# Patient Record
Sex: Male | Born: 1980
Health system: Southern US, Community
[De-identification: ages and names within clinical notes are randomized; demographics above are authoritative.]

## PROBLEM LIST (undated history)

## (undated) DIAGNOSIS — K219 Gastro-esophageal reflux disease without esophagitis: Secondary | ICD-10-CM

## (undated) DIAGNOSIS — G4700 Insomnia, unspecified: Secondary | ICD-10-CM

## (undated) HISTORY — DX: Insomnia, unspecified: G47.00

## (undated) HISTORY — PX: VASECTOMY: SHX75

## (undated) HISTORY — DX: Gastro-esophageal reflux disease without esophagitis: K21.9

---

## 2001-03-02 ENCOUNTER — Emergency Department (HOSPITAL_COMMUNITY): Admission: EM | Admit: 2001-03-02 | Discharge: 2001-03-02 | Payer: Self-pay | Admitting: Emergency Medicine

## 2001-03-02 ENCOUNTER — Encounter: Payer: Self-pay | Admitting: Emergency Medicine

## 2012-12-25 ENCOUNTER — Other Ambulatory Visit: Payer: Self-pay | Admitting: *Deleted

## 2012-12-25 MED ORDER — ZOLPIDEM TARTRATE 10 MG PO TABS: 10.0000 mg | ORAL_TABLET | Freq: Every evening | ORAL | Status: DC | PRN

## 2012-12-25 NOTE — Telephone Encounter (Signed)
LAST OV 1/14. LAST REFILL 10/31/12. CALL IN [facility] [place] [phone]

## 2013-01-05 ENCOUNTER — Telehealth: Payer: Self-pay | Admitting: *Deleted

## 2013-01-05 NOTE — Telephone Encounter (Signed)
Patient left message on my voicemail requesting refill on Ambien.  States that pharmacy has tried twice, but hasn't heard anything.

## 2013-01-06 NOTE — Telephone Encounter (Signed)
Done by Joyce Gross

## 2013-01-06 NOTE — Telephone Encounter (Signed)
RX called into pharmacy

## 2013-01-09 ENCOUNTER — Telehealth: Payer: Self-pay | Admitting: Nurse Practitioner

## 2013-01-09 NOTE — Telephone Encounter (Signed)
I called [facility] pharmacy in [place] and confirmed that it was filled and waiting for the patient to pick up. I called and advised the patient it was waiting for him at the pharmacy.

## 2013-03-31 ENCOUNTER — Other Ambulatory Visit: Payer: Self-pay | Admitting: *Deleted

## 2013-03-31 MED ORDER — PANTOPRAZOLE SODIUM 40 MG PO TBEC: 40.0000 mg | DELAYED_RELEASE_TABLET | Freq: Every day | ORAL | Status: DC

## 2013-03-31 NOTE — Telephone Encounter (Signed)
SLTT OV 1/14. MED NOT LISTED IN EPIC CHART BUT IN PAPER CHART. ACM PT

## 2013-04-07 ENCOUNTER — Other Ambulatory Visit: Payer: Self-pay | Admitting: *Deleted

## 2013-04-07 MED ORDER — ZOLPIDEM TARTRATE 10 MG PO TABS: 10.0000 mg | ORAL_TABLET | Freq: Every evening | ORAL | Status: DC | PRN

## 2013-04-07 NOTE — Telephone Encounter (Signed)
Patient last seen in office on 09-06-12 by [facility]. Rx last filled on 03-16-13 for #20. Please advise. If approved please route to Pool B so nurse can phone in to [place] in [place]

## 2013-04-07 NOTE — Telephone Encounter (Signed)
Please call in ambien rx with 1 refill 

## 2013-04-08 NOTE — Telephone Encounter (Signed)
Called in.

## 2013-07-24 ENCOUNTER — Other Ambulatory Visit: Payer: Self-pay | Admitting: Family Medicine

## 2013-07-24 ENCOUNTER — Ambulatory Visit (HOSPITAL_COMMUNITY)
Admission: RE | Admit: 2013-07-24 | Discharge: 2013-07-24 | Disposition: A | Discharge status: Home / Self Care | Payer: BC Managed Care – PPO | Source: Ambulatory Visit | Attending: Family Medicine | Admitting: Family Medicine

## 2013-07-24 ENCOUNTER — Encounter: Payer: Self-pay | Admitting: Family Medicine

## 2013-07-24 ENCOUNTER — Ambulatory Visit (INDEPENDENT_AMBULATORY_CARE_PROVIDER_SITE_OTHER): Payer: BC Managed Care – PPO | Admitting: Family Medicine

## 2013-07-24 ENCOUNTER — Ambulatory Visit (HOSPITAL_COMMUNITY): Payer: Self-pay

## 2013-07-24 ENCOUNTER — Encounter (INDEPENDENT_AMBULATORY_CARE_PROVIDER_SITE_OTHER): Payer: Self-pay

## 2013-07-24 VITALS — BP 125/82 | HR 75 | Temp 99.3°F | Ht 73.0 in | Wt 234.0 lb

## 2013-07-24 DIAGNOSIS — N5089 Other specified disorders of the male genital organs: Secondary | ICD-10-CM

## 2013-07-24 DIAGNOSIS — N451 Epididymitis: Secondary | ICD-10-CM

## 2013-07-24 DIAGNOSIS — N453 Epididymo-orchitis: Secondary | ICD-10-CM

## 2013-07-24 DIAGNOSIS — N433 Hydrocele, unspecified: Secondary | ICD-10-CM | POA: Insufficient documentation

## 2013-07-24 LAB — POCT UA - MICROSCOPIC ONLY
Bacteria, U Microscopic: NEGATIVE
Casts, Ur, LPF, POC: NEGATIVE
Crystals, Ur, HPF, POC: NEGATIVE
RBC, urine, microscopic: NEGATIVE
WBC, Ur, HPF, POC: NEGATIVE
Yeast, UA: NEGATIVE

## 2013-07-24 LAB — POCT URINALYSIS DIPSTICK
Bilirubin, UA: NEGATIVE
Blood, UA: NEGATIVE
Glucose, UA: NEGATIVE
Ketones, UA: NEGATIVE
Leukocytes, UA: NEGATIVE
Nitrite, UA: NEGATIVE
Protein, UA: NEGATIVE
Spec Grav, UA: >=1.03
Urobilinogen, UA: NEGATIVE
pH, UA: 6.5

## 2013-07-24 MED ORDER — DOXYCYCLINE HYCLATE 100 MG PO CAPS: 100.0000 mg | ORAL_CAPSULE | Freq: Two times a day (BID) | ORAL | Status: DC

## 2013-07-24 MED ORDER — PANTOPRAZOLE SODIUM 40 MG PO TBEC: 40.0000 mg | DELAYED_RELEASE_TABLET | Freq: Every day | ORAL | Status: DC

## 2013-07-24 MED ORDER — LIDOCAINE HCL 1 % IJ SOLN
250.0000 mg | INTRAMUSCULAR | Status: AC
  Administered 2013-07-24: 250 mg via INTRAMUSCULAR

## 2013-07-24 NOTE — Progress Notes (Signed)
  Subjective:    Patient ID: Alexander Fowler, male    DOB: 1981-02-10, 32 y.o.   MRN: [id number]  HPI Pt presents today with chief complaint of testicular pain  Was playing xbox kinect earlier in the week (boxing) Woke up next day feeling generally sore  Around lunchtime, pt had progressive severe L sided testicular pain and sore ness  No fever or chills No dysuria, increased urinary frequency, hematuria No hx/o STDs No new sexual partners (wife is in room with pt-both denyany new sexual partners). No discharge.  Pain has been intermittent, severe,  and not associated with activity.  Has back pain that is chronic, no change in severity.      Review of Systems  All other systems reviewed and are negative.       Objective:   Physical Exam  Constitutional: He appears well-developed and well-nourished.  HENT:  Head: Normocephalic and atraumatic.  Eyes: Conjunctivae are normal. Pupils are equal, round, and reactive to light.  Neck: Normal range of motion.  Cardiovascular: Normal rate and regular rhythm.   Pulmonary/Chest: Effort normal and breath sounds normal.  Abdominal: Soft.  Genitourinary: Penis normal. No penile tenderness.  ? l sided scrotal mass near base of pampiniform plexus     Musculoskeletal: Normal range of motion.  Neurological: He is alert.  Skin: Skin is warm.          Assessment & Plan:  Swollen testicle - Plan: POCT urinalysis dipstick, POCT UA - Microscopic Only  Epididymitis - Plan: doxycycline (VIBRAMYCIN) 100 MG capsule, Urine culture  Given exam and history, higher concern for epipdidymitis.  Rocephin 250mg   IM x1  Extended course of doxy Chek urine culture, urine GC/Chl, HIV, RPR  Scrotal ultrasound to assess for any mass.  Discussed infectious and GU red flags at length.  Go to ER if sxs worsen despite treatment.

## 2013-07-25 LAB — STD PANEL
HIV 1/O/2 Abs-Index Value: <1 (ref <1.00)
HIV-1/HIV-2 Ab: NONREACTIVE
Hepatitis B Surface Ag: NEGATIVE
RPR: NONREACTIVE

## 2013-07-25 LAB — URINE CULTURE: Organism ID, Bacteria: NO GROWTH

## 2013-07-28 LAB — GC/CHLAMYDIA PROBE AMP
Chlamydia trachomatis, NAA: NEGATIVE
Neisseria gonorrhoeae by PCR: NEGATIVE

## 2013-09-10 ENCOUNTER — Telehealth: Payer: Self-pay | Admitting: Family Medicine

## 2013-09-11 MED ORDER — ZOLPIDEM TARTRATE 10 MG PO TABS: 10.0000 mg | ORAL_TABLET | Freq: Every evening | ORAL | Status: DC | PRN

## 2013-09-11 NOTE — Telephone Encounter (Signed)
Please call in ambien 10mg 1 po qhs #30 1 refill 

## 2013-09-16 MED ORDER — ZOLPIDEM TARTRATE 10 MG PO TABS: 10.0000 mg | ORAL_TABLET | Freq: Every evening | ORAL | Status: DC | PRN

## 2013-09-16 NOTE — Telephone Encounter (Signed)
Pt aware, rx for ambein [facility]

## 2013-09-16 NOTE — Telephone Encounter (Signed)
Please call in ambien 10mg  1 po qhs #30 1 refill

## 2013-12-18 ENCOUNTER — Other Ambulatory Visit: Payer: Self-pay | Admitting: Nurse Practitioner

## 2013-12-21 NOTE — Telephone Encounter (Signed)
Last ov 11/14. Last refill 11/03/13. [facility]

## 2013-12-28 ENCOUNTER — Other Ambulatory Visit: Payer: Self-pay

## 2013-12-28 ENCOUNTER — Telehealth: Payer: Self-pay | Admitting: Family Medicine

## 2013-12-28 MED ORDER — ZOLPIDEM TARTRATE 10 MG PO TABS: 10.0000 mg | ORAL_TABLET | Freq: Every evening | ORAL | Status: DC | PRN

## 2013-12-28 NOTE — Telephone Encounter (Signed)
Last seen 07/24/13  Dr Alvester MorinNewton    If approved route [facility]  [phone]

## 2013-12-28 NOTE — Telephone Encounter (Signed)
Called in.

## 2013-12-28 NOTE — Telephone Encounter (Signed)
Please call in ambien with 1 refills 

## 2014-04-01 ENCOUNTER — Other Ambulatory Visit: Payer: Self-pay | Admitting: *Deleted

## 2014-04-01 ENCOUNTER — Telehealth: Payer: Self-pay | Admitting: Family Medicine

## 2014-04-01 MED ORDER — ZOLPIDEM TARTRATE 10 MG PO TABS: 10.0000 mg | ORAL_TABLET | Freq: Every evening | ORAL | Status: DC | PRN

## 2014-04-01 NOTE — Telephone Encounter (Signed)
[facility] Rx last filled on 02-18-14 for #30. Please advise. If approved please route to Pool B so [facility] [phone]

## 2014-04-01 NOTE — Telephone Encounter (Signed)
no more refills without being seen Please call in [place] with 1 refills

## 2014-04-01 NOTE — Telephone Encounter (Signed)
Rx already sent in

## 2014-04-02 NOTE — Telephone Encounter (Signed)
Called in.

## 2014-07-20 ENCOUNTER — Ambulatory Visit (INDEPENDENT_AMBULATORY_CARE_PROVIDER_SITE_OTHER): Payer: BC Managed Care – PPO | Admitting: Family Medicine

## 2014-07-20 ENCOUNTER — Encounter: Payer: Self-pay | Admitting: Family Medicine

## 2014-07-20 VITALS — BP 118/75 | HR 98 | Temp 97.8°F | Ht 73.0 in | Wt 236.0 lb

## 2014-07-20 DIAGNOSIS — K2100 Gastro-esophageal reflux disease with esophagitis, without bleeding: Secondary | ICD-10-CM

## 2014-07-20 DIAGNOSIS — G4700 Insomnia, unspecified: Secondary | ICD-10-CM

## 2014-07-20 MED ORDER — ZOLPIDEM TARTRATE 10 MG PO TABS: 10.0000 mg | ORAL_TABLET | Freq: Every evening | ORAL | Status: DC | PRN

## 2014-07-20 MED ORDER — PANTOPRAZOLE SODIUM 40 MG PO TBEC: 40.0000 mg | DELAYED_RELEASE_TABLET | Freq: Every day | ORAL | Status: DC

## 2014-07-20 NOTE — Progress Notes (Signed)
   Subjective:    Patient ID: Alexander Fowler, male    DOB: 08-04-1981, 33 y.o.   MRN: [id number]  HPI Patient is here for complaints of inattention.  He states he is having some problems at work.  He has been having problems keeping attention according to his wife.  He has been married for 15 months.  He states that his wife is noticing that he has had a change.  He works night shift and has been having sleeping problems.  He is taking ambien for sleep problems.  He denies depression.  He wants refills on his medications.  Review of Systems  Constitutional: Negative for fever.  HENT: Negative for ear pain.   Eyes: Negative for discharge.  Respiratory: Negative for cough.   Cardiovascular: Negative for chest pain.  Gastrointestinal: Negative for abdominal distention.  Endocrine: Negative for polyuria.  Genitourinary: Negative for difficulty urinating.  Musculoskeletal: Negative for gait problem and neck pain.  Skin: Negative for color change and rash.  Neurological: Negative for speech difficulty and headaches.  Psychiatric/Behavioral: Negative for agitation.       Objective:    BP 118/75 mmHg  Pulse 98  Temp(Src) 97.8 F (36.6 C) (Oral)  Ht 6\' 1"  (1.854 m)  Wt 236 lb (107.049 kg)  BMI 31.14 kg/m2   Physical Exam  Constitutional: He is oriented to person, place, and time. He appears well-developed and well-nourished.  HENT:  Head: Normocephalic and atraumatic.  Mouth/Throat: Oropharynx is clear and moist.  Eyes: Pupils are equal, round, and reactive to light.  Neck: Normal range of motion. Neck supple.  Cardiovascular: Normal rate and regular rhythm.   No murmur heard. Pulmonary/Chest: Effort normal and breath sounds normal.  Abdominal: Soft. Bowel sounds are normal. There is no tenderness.  Neurological: He is alert and oriented to person, place, and time.  Skin: Skin is warm and dry.  Psychiatric: He has a normal mood and affect.          Assessment & Plan:      ICD-9-CM ICD-10-CM   1. Insomnia 780.52 G47.00 zolpidem (AMBIEN) 10 MG tablet     DISCONTINUED: zolpidem (AMBIEN) 10 MG tablet  2. Gastroesophageal reflux disease with esophagitis 530.11 K21.0 pantoprazole (PROTONIX) 40 MG tablet     No Follow-up on file.  Alexander CanterWilliam J Oxford FNP

## 2015-02-15 ENCOUNTER — Encounter (INDEPENDENT_AMBULATORY_CARE_PROVIDER_SITE_OTHER): Payer: Self-pay

## 2015-02-15 ENCOUNTER — Ambulatory Visit (INDEPENDENT_AMBULATORY_CARE_PROVIDER_SITE_OTHER): Payer: BLUE CROSS/BLUE SHIELD | Admitting: Physician Assistant

## 2015-02-15 ENCOUNTER — Encounter: Payer: Self-pay | Admitting: Physician Assistant

## 2015-02-15 VITALS — BP 126/77 | HR 70 | Temp 98.4°F | Ht 73.0 in | Wt 242.0 lb

## 2015-02-15 DIAGNOSIS — G4700 Insomnia, unspecified: Secondary | ICD-10-CM

## 2015-02-15 DIAGNOSIS — R3581 Nocturnal polyuria: Secondary | ICD-10-CM

## 2015-02-15 DIAGNOSIS — R358 Other polyuria: Secondary | ICD-10-CM

## 2015-02-15 DIAGNOSIS — R351 Nocturia: Secondary | ICD-10-CM

## 2015-02-15 DIAGNOSIS — K2100 Gastro-esophageal reflux disease with esophagitis, without bleeding: Secondary | ICD-10-CM

## 2015-02-15 DIAGNOSIS — R350 Frequency of micturition: Secondary | ICD-10-CM

## 2015-02-15 LAB — POCT CBC
Granulocyte percent: 43.1 %G (ref 37–80)
HEMATOCRIT: 50 % (ref 43.5–53.7)
HEMOGLOBIN: 16.5 g/dL (ref 14.1–18.1)
Lymph, poc: 3.4 (ref 0.6–3.4)
MCH: 30.7 pg (ref 27–31.2)
MCHC: 33 g/dL (ref 31.8–35.4)
MCV: 93.2 fL (ref 80–97)
MPV: 7.7 fL (ref 0–99.8)
POC Granulocyte: 3.1 (ref 2–6.9)
POC LYMPH PERCENT: 47.9 %L (ref 10–50)
Platelet Count, POC: 190 10*3/uL (ref 142–424)
RBC: 5.37 M/uL (ref 4.69–6.13)
RDW, POC: 12.4 %
WBC: 7.2 10*3/uL (ref 4.6–10.2)

## 2015-02-15 LAB — POCT URINALYSIS DIPSTICK
BILIRUBIN UA: NEGATIVE
GLUCOSE UA: NEGATIVE
KETONES UA: NEGATIVE
Leukocytes, UA: NEGATIVE
Nitrite, UA: NEGATIVE
PH UA: 6
PROTEIN UA: NEGATIVE
RBC UA: NEGATIVE
SPEC GRAV UA: 1.015
Urobilinogen, UA: NEGATIVE

## 2015-02-15 LAB — POCT UA - MICROSCOPIC ONLY
BACTERIA, U MICROSCOPIC: NEGATIVE
CRYSTALS, UR, HPF, POC: NEGATIVE
Casts, Ur, LPF, POC: NEGATIVE
Mucus, UA: NEGATIVE
RBC, urine, microscopic: NEGATIVE
WBC, Ur, HPF, POC: NEGATIVE
Yeast, UA: NEGATIVE

## 2015-02-15 LAB — POCT GLYCOSYLATED HEMOGLOBIN (HGB A1C): HEMOGLOBIN A1C: 5.1

## 2015-02-15 MED ORDER — ZOLPIDEM TARTRATE 10 MG PO TABS: 10.0000 mg | ORAL_TABLET | Freq: Every day | ORAL | Status: DC

## 2015-02-15 MED ORDER — PANTOPRAZOLE SODIUM 40 MG PO TBEC: 40.0000 mg | DELAYED_RELEASE_TABLET | Freq: Every day | ORAL | Status: DC

## 2015-02-15 NOTE — Progress Notes (Signed)
Subjective:    Patient ID: Alexander Fowler, male    DOB: Dec 09, 1980, 34 y.o.   MRN: [id number]  HPI33 y/o male presents with c/o urinary frequency and urgency. He states that if he drinks a glass of water and coffee in the morning, he urinates 6 times over the next hour. He has not noticed this in the past  No family h/o DM. He has been taking Plexus for weight loss, which possibly has a diuretic but patient is unsure. He states that he had frequent urination prior to taking Plexus   He also needs refills for his Pantoprozole and ambien for GERD and insomnia .     Review of Systems  Constitutional: Negative.  Negative for unexpected weight change.  HENT: Negative.   Respiratory: Negative.   Gastrointestinal: Negative.        GERD under control with Pantoprozole  Endocrine: Positive for polyuria. Negative for polydipsia and polyphagia.       Nocturia  Genitourinary: Positive for urgency and frequency. Negative for dysuria, hematuria, flank pain and difficulty urinating.  Musculoskeletal: Negative.   Psychiatric/Behavioral:       Insomnia under control with ambien qhs        Objective:   Physical Exam  Constitutional: He is oriented to person, place, and time. He appears well-developed and well-nourished.  Abdominal: Soft. He exhibits no distension and no mass. There is no tenderness. There is no rebound and no guarding.  Musculoskeletal: Normal range of motion. He exhibits no edema.  Neurological: He is alert and oriented to person, place, and time.  Psychiatric: He has a normal mood and affect. His behavior is normal. Judgment and thought content normal.  Nursing note and vitals reviewed.  Results for orders placed or performed in visit on 02/15/15  POCT CBC  Result Value Ref Range   WBC 7.2 4.6 - 10.2 K/uL   Lymph, poc 3.4 0.6 - 3.4   POC LYMPH PERCENT 47.9 10 - 50 %L   POC Granulocyte 3.1 2 - 6.9   Granulocyte percent 43.1 37 - 80 %G   RBC 5.37 4.69 - 6.13 M/uL   Hemoglobin 16.5 14.1 - 18.1 g/dL   HCT, POC 50.0 43.5 - 53.7 %   MCV 93.2 80 - 97 fL   MCH, POC 30.7 27 - 31.2 pg   MCHC 33.0 31.8 - 35.4 g/dL   RDW, POC 12.4 %   Platelet Count, POC 190 142 - 424 K/uL   MPV 7.7 0 - 99.8 fL  POCT glycosylated hemoglobin (Hb A1C)  Result Value Ref Range   Hemoglobin A1C 5.1   POCT urinalysis dipstick  Result Value Ref Range   Color, UA yellow    Clarity, UA clear    Glucose, UA neg    Bilirubin, UA neg    Ketones, UA neg    Spec Grav, UA 1.015    Blood, UA neg    pH, UA 6.0    Protein, UA neg    Urobilinogen, UA negative    Nitrite, UA neg    Leukocytes, UA Negative Negative  POCT UA - Microscopic Only  Result Value Ref Range   WBC, Ur, HPF, POC neg    RBC, urine, microscopic neg    Bacteria, U Microscopic neg    Mucus, UA neg    Epithelial cells, urine per micros occ    Crystals, Ur, HPF, POC neg    Casts, Ur, LPF, POC neg    Yeast, UA  neg    Filed Vitals:   02/15/15 0817  BP: 126/77  Pulse: 70  Temp: 98.4 F (36.9 C)           Assessment & Plan:  1. Gastroesophageal reflux disease with esophagitis  - pantoprazole (PROTONIX) 40 MG tablet; Take 1 tablet (40 mg total) by mouth daily.  Dispense: 30 tablet; Refill: 11  2. Nocturnal polyuria  - CMP14+EGFR - PSA, total and free - POCT glycosylated hemoglobin (Hb A1C) - POCT urinalysis dipstick - POCT UA - Microscopic Only - Ambulatory referral to Urology  3. Frequency of urination and polyuria  - POCT CBC - CMP14+EGFR - PSA, total and free - POCT glycosylated hemoglobin (Hb A1C) - POCT urinalysis dipstick - POCT UA - Microscopic Only - Ambulatory referral to Urology  4. Insomnia  - zolpidem (AMBIEN) 10 MG tablet; Take 1 tablet (10 mg total) by mouth at bedtime.  Dispense: 30 tablet; Refill: 2   I feel that his frequency of urination is most likely due to consumption of fluids and output=intake, however, he would like further assessment to r/o underlying causes  related to abnormal bladder function. CMP ordered to r/o abnormal kidney function. U/A, CBC, PSA, HbA1C all WNL. Follow up with urology is preferred by patient.   Taylee Gunnells A. Jermon Stain PA-C

## 2015-02-16 LAB — CMP14+EGFR
ALBUMIN: 4.8 g/dL (ref 3.5–5.5)
ALK PHOS: 79 IU/L (ref 39–117)
ALT: 36 IU/L (ref 0–44)
AST: 32 IU/L (ref 0–40)
Albumin/Globulin Ratio: 1.9 (ref 1.1–2.5)
BUN/Creatinine Ratio: 16 (ref 8–19)
BUN: 18 mg/dL (ref 6–20)
Bilirubin Total: 0.5 mg/dL (ref 0.0–1.2)
CHLORIDE: 97 mmol/L (ref 97–108)
CO2: 26 mmol/L (ref 18–29)
Calcium: 9.9 mg/dL (ref 8.7–10.2)
Creatinine, Ser: 1.1 mg/dL (ref 0.76–1.27)
GFR calc Af Amer: 101 mL/min/{1.73_m2} (ref >59)
GFR, EST NON AFRICAN AMERICAN: 87 mL/min/{1.73_m2} (ref >59)
GLUCOSE: 66 mg/dL (ref 65–99)
Globulin, Total: 2.5 g/dL (ref 1.5–4.5)
Potassium: 4.5 mmol/L (ref 3.5–5.2)
SODIUM: 144 mmol/L (ref 134–144)
Total Protein: 7.3 g/dL (ref 6.0–8.5)

## 2015-02-16 LAB — PSA, TOTAL AND FREE
PSA, Free Pct: 57.5 %
PSA, Free: 0.23 ng/mL
Prostate Specific Ag, Serum: 0.4 ng/mL (ref 0.0–4.0)

## 2015-03-22 ENCOUNTER — Ambulatory Visit: Payer: Self-pay | Admitting: Urology

## 2015-11-15 ENCOUNTER — Telehealth: Payer: Self-pay | Admitting: Family

## 2015-11-15 ENCOUNTER — Ambulatory Visit (INDEPENDENT_AMBULATORY_CARE_PROVIDER_SITE_OTHER): Payer: BLUE CROSS/BLUE SHIELD | Admitting: Family

## 2015-11-15 ENCOUNTER — Encounter: Payer: Self-pay | Admitting: Family

## 2015-11-15 VITALS — BP 128/87 | HR 82 | Temp 97.4°F | Ht 73.0 in | Wt 239.0 lb

## 2015-11-15 DIAGNOSIS — J0100 Acute maxillary sinusitis, unspecified: Secondary | ICD-10-CM | POA: Diagnosis not present

## 2015-11-15 DIAGNOSIS — K2100 Gastro-esophageal reflux disease with esophagitis, without bleeding: Secondary | ICD-10-CM

## 2015-11-15 DIAGNOSIS — G4700 Insomnia, unspecified: Secondary | ICD-10-CM | POA: Diagnosis not present

## 2015-11-15 DIAGNOSIS — K219 Gastro-esophageal reflux disease without esophagitis: Secondary | ICD-10-CM | POA: Insufficient documentation

## 2015-11-15 LAB — CMP14+EGFR
ALT: 31 IU/L (ref 0–44)
AST: 31 IU/L (ref 0–40)
Albumin/Globulin Ratio: 1.7 (ref 1.2–2.2)
Albumin: 4.6 g/dL (ref 3.5–5.5)
Alkaline Phosphatase: 83 IU/L (ref 39–117)
BUN/Creatinine Ratio: 13 (ref 8–19)
BUN: 13 mg/dL (ref 6–20)
Bilirubin Total: 1 mg/dL (ref 0.0–1.2)
CALCIUM: 10 mg/dL (ref 8.7–10.2)
CO2: 28 mmol/L (ref 18–29)
Chloride: 99 mmol/L (ref 96–106)
Creatinine, Ser: 0.99 mg/dL (ref 0.76–1.27)
GFR calc Af Amer: 114 mL/min/{1.73_m2} (ref >59)
GFR, EST NON AFRICAN AMERICAN: 98 mL/min/{1.73_m2} (ref >59)
GLUCOSE: 75 mg/dL (ref 65–99)
Globulin, Total: 2.7 g/dL (ref 1.5–4.5)
POTASSIUM: 4.3 mmol/L (ref 3.5–5.2)
Sodium: 142 mmol/L (ref 134–144)
Total Protein: 7.3 g/dL (ref 6.0–8.5)

## 2015-11-15 MED ORDER — ZOLPIDEM TARTRATE 10 MG PO TABS: 10.0000 mg | ORAL_TABLET | Freq: Every day | ORAL | Status: DC

## 2015-11-15 MED ORDER — PANTOPRAZOLE SODIUM 40 MG PO TBEC: 40.0000 mg | DELAYED_RELEASE_TABLET | Freq: Every day | ORAL | Status: DC

## 2015-11-15 MED ORDER — AMOXICILLIN-POT CLAVULANATE 875-125 MG PO TABS: 1.0000 | ORAL_TABLET | Freq: Two times a day (BID) | ORAL | Status: DC

## 2015-11-15 MED ORDER — FLUTICASONE PROPIONATE 50 MCG/ACT NA SUSP: 2.0000 | Freq: Every day | NASAL | Status: DC

## 2015-11-15 NOTE — Progress Notes (Signed)
Subjective:    Patient ID: Alexander Fowler, male    DOB: 09-08-1980, 35 y.o.   MRN: [id number]  Pt presents to the office today for chronic follow up.  Sinus Problem This is a new problem. The current episode started in the past 7 days. The problem is unchanged. There has been no fever. His pain is at a severity of 10/10 (sinus pressure). He is experiencing no pain. Associated symptoms include congestion, ear pain, a hoarse voice, sinus pressure, sneezing and a sore throat. Pertinent negatives include no coughing or headaches. Past treatments include acetaminophen. The treatment provided mild relief.  Insomnia Primary symptoms: difficulty falling asleep, premature morning awakening, malaise/fatigue.  The current episode started more than one year. The onset quality is gradual. The problem occurs intermittently. The symptoms are aggravated by work stress. Past treatments include meditation. The treatment provided moderate relief.  Gastroesophageal Reflux He complains of a hoarse voice and a sore throat. He reports no coughing or no heartburn. This is a chronic problem. The current episode started more than 1 year ago. The problem occurs occasionally. The problem has been waxing and waning. The symptoms are aggravated by lying down. Risk factors include obesity. He has tried a PPI for the symptoms. The treatment provided significant relief.      Review of Systems  Constitutional: Positive for malaise/fatigue.  HENT: Positive for congestion, ear pain, hoarse voice, sinus pressure, sneezing and sore throat.   Respiratory: Negative.  Negative for cough.   Cardiovascular: Negative.   Gastrointestinal: Negative.  Negative for heartburn.  Endocrine: Negative.   Genitourinary: Negative.   Musculoskeletal: Negative.   Neurological: Negative.  Negative for headaches.  Hematological: Negative.   Psychiatric/Behavioral: The patient has insomnia.   All other systems reviewed and are  negative.      Objective:   Physical Exam  Constitutional: He is oriented to person, place, and time. He appears well-developed and well-nourished. No distress.  HENT:  Head: Normocephalic.  Right Ear: External ear normal.  Left Ear: External ear normal.  Nose: Right sinus exhibits maxillary sinus tenderness. Left sinus exhibits maxillary sinus tenderness.  Nasal passage erythemas with mild swelling  Oropharynx erythemas  Eyes: Pupils are equal, round, and reactive to light. Right eye exhibits no discharge. Left eye exhibits no discharge.  Neck: Normal range of motion. Neck supple. No thyromegaly present.  Cardiovascular: Normal rate, regular rhythm, normal heart sounds and intact distal pulses.   No murmur heard. Pulmonary/Chest: Effort normal and breath sounds normal. No respiratory distress. He has no wheezes.  Abdominal: Soft. Bowel sounds are normal. He exhibits no distension. There is no tenderness.  Musculoskeletal: Normal range of motion. He exhibits no edema or tenderness.  Neurological: He is alert and oriented to person, place, and time. He has normal reflexes. No cranial nerve deficit.  Skin: Skin is warm and dry. No rash noted. No erythema.  Psychiatric: He has a normal mood and affect. His behavior is normal. Judgment and thought content normal.  Vitals reviewed.     BP 128/87 mmHg  Pulse 82  Temp(Src) 97.4 F (36.3 C) (Oral)  Ht '6\' 1"'$  (1.854 m)  Wt 239 lb (108.41 kg)  BMI 31.54 kg/m2     Assessment & Plan:  1. Gastroesophageal reflux disease, esophagitis presence not specified - CMP14+EGFR  2. Acute maxillary sinusitis, recurrence not specified -- Take meds as prescribed - Use a cool mist humidifier  -Use saline nose sprays frequently -Saline irrigations of the nose can  be very helpful if done frequently.  * 4X daily for 1 week*  * Use of a nettie pot can be helpful with this. Follow directions with this* -Force fluids -For any cough or  congestion  Use plain Mucinex- regular strength or max strength is fine   * Children- consult with Pharmacist for dosing -For fever or aces or pains- take tylenol or ibuprofen appropriate for age and weight.  * for fevers greater than 101 orally you may alternate ibuprofen and tylenol every  3 hours. -Throat lozenges if help - CMP14+EGFR - amoxicillin-clavulanate (AUGMENTIN) 875-125 MG tablet; Take 1 tablet by mouth 2 (two) times daily.  Dispense: 14 tablet; Refill: 0 - fluticasone (FLONASE) 50 MCG/ACT nasal spray; Place 2 sprays into both nostrils daily.  Dispense: 16 g; Refill: 6  3. Insomnia    Continue all meds Labs pending Health Maintenance reviewed Diet and exercise encouraged RTO 1 year   Evelina Dun, FNP

## 2015-11-15 NOTE — Patient Instructions (Signed)
Sinusitis, Adult Sinusitis is redness, soreness, and inflammation of the paranasal sinuses. Paranasal sinuses are air pockets within the bones of your face. They are located beneath your eyes, in the middle of your forehead, and above your eyes. In healthy paranasal sinuses, mucus is able to drain out, and air is able to circulate through them by way of your nose. However, when your paranasal sinuses are inflamed, mucus and air can become trapped. This can allow bacteria and other germs to grow and cause infection. Sinusitis can develop quickly and last only a short time (acute) or continue over a long period (chronic). Sinusitis that lasts for more than 12 weeks is considered chronic. CAUSES Causes of sinusitis include:  Allergies.  Structural abnormalities, such as displacement of the cartilage that separates your nostrils (deviated septum), which can decrease the air flow through your nose and sinuses and affect sinus drainage.  Functional abnormalities, such as when the small hairs (cilia) that line your sinuses and help remove mucus do not work properly or are not present. SIGNS AND SYMPTOMS Symptoms of acute and chronic sinusitis are the same. The primary symptoms are pain and pressure around the affected sinuses. Other symptoms include:  Upper toothache.  Earache.  Headache.  Bad breath.  Decreased sense of smell and taste.  A cough, which worsens when you are lying flat.  Fatigue.  Fever.  Thick drainage from your nose, which often is green and may contain pus (purulent).  Swelling and warmth over the affected sinuses. DIAGNOSIS Your health care provider will perform a physical exam. During your exam, your health care provider may perform any of the following to help determine if you have acute sinusitis or chronic sinusitis:  Look in your nose for signs of abnormal growths in your nostrils (nasal polyps).  Tap over the affected sinus to check for signs of  infection.  View the inside of your sinuses using an imaging device that has a light attached (endoscope). If your health care provider suspects that you have chronic sinusitis, one or more of the following tests may be recommended:  Allergy tests.  Nasal culture. A sample of mucus is taken from your nose, sent to a lab, and screened for bacteria.  Nasal cytology. A sample of mucus is taken from your nose and examined by your health care provider to determine if your sinusitis is related to an allergy. TREATMENT Most cases of acute sinusitis are related to a viral infection and will resolve on their own within 10 days. Sometimes, medicines are prescribed to help relieve symptoms of both acute and chronic sinusitis. These may include pain medicines, decongestants, nasal steroid sprays, or saline sprays. However, for sinusitis related to a bacterial infection, your health care provider will prescribe antibiotic medicines. These are medicines that will help kill the bacteria causing the infection. Rarely, sinusitis is caused by a fungal infection. In these cases, your health care provider will prescribe antifungal medicine. For some cases of chronic sinusitis, surgery is needed. Generally, these are cases in which sinusitis recurs more than 3 times per year, despite other treatments. HOME CARE INSTRUCTIONS  Drink plenty of water. Water helps thin the mucus so your sinuses can drain more easily.  Use a humidifier.  Inhale steam 3-4 times a day (for example, sit in the bathroom with the shower running).  Apply a warm, moist washcloth to your face 3-4 times a day, or as directed by your health care provider.  Use saline nasal sprays to help   moisten and clean your sinuses.  Take medicines only as directed by your health care provider.  If you were prescribed either an antibiotic or antifungal medicine, finish it all even if you start to feel better. SEEK IMMEDIATE MEDICAL CARE IF:  You have  increasing pain or severe headaches.  You have nausea, vomiting, or drowsiness.  You have swelling around your face.  You have vision problems.  You have a stiff neck.  You have difficulty breathing.   This information is not intended to replace advice given to you by your health care provider. Make sure you discuss any questions you have with your health care provider.   Document Released: 08/20/2005 Document Revised: 09/10/2014 Document Reviewed: 09/04/2011 Elsevier Interactive Patient Education 2016 [employer]  - Take meds as prescribed - Use a cool mist humidifier  -Use saline nose sprays frequently -Saline irrigations of the nose can be very helpful if done frequently.  * 4X daily for 1 week*  * Use of a nettie pot can be helpful with this. Follow directions with this* -Force fluids -For any cough or congestion  Use plain Mucinex- regular strength or max strength is fine   * Children- consult with Pharmacist for dosing -For fever or aces or pains- take tylenol or ibuprofen appropriate for age and weight.  * for fevers greater than 101 orally you may alternate ibuprofen and tylenol every  3 hours. -Throat lozenges if help   Ronak Duquette, FNP   

## 2015-11-15 NOTE — Addendum Note (Signed)
Addended by: Jannifer RodneyHAWKS, Sally Reimers A on: 11/15/2015 10:41 AM   Modules accepted: Orders

## 2015-11-15 NOTE — Telephone Encounter (Signed)
PLease call in medication and let patient knw=ow. Thanks

## 2015-11-15 NOTE — Telephone Encounter (Signed)
Patient aware.

## 2015-12-31 DIAGNOSIS — M545 Low back pain: Secondary | ICD-10-CM | POA: Diagnosis not present

## 2016-01-02 DIAGNOSIS — S29012A Strain of muscle and tendon of back wall of thorax, initial encounter: Secondary | ICD-10-CM | POA: Diagnosis not present

## 2016-01-02 DIAGNOSIS — M546 Pain in thoracic spine: Secondary | ICD-10-CM | POA: Diagnosis not present

## 2016-01-02 DIAGNOSIS — S134XXA Sprain of ligaments of cervical spine, initial encounter: Secondary | ICD-10-CM | POA: Diagnosis not present

## 2016-01-02 DIAGNOSIS — S39012A Strain of muscle, fascia and tendon of lower back, initial encounter: Secondary | ICD-10-CM | POA: Diagnosis not present

## 2016-01-03 DIAGNOSIS — S29012A Strain of muscle and tendon of back wall of thorax, initial encounter: Secondary | ICD-10-CM | POA: Diagnosis not present

## 2016-01-03 DIAGNOSIS — S39012A Strain of muscle, fascia and tendon of lower back, initial encounter: Secondary | ICD-10-CM | POA: Diagnosis not present

## 2016-01-03 DIAGNOSIS — M546 Pain in thoracic spine: Secondary | ICD-10-CM | POA: Diagnosis not present

## 2016-01-03 DIAGNOSIS — S134XXA Sprain of ligaments of cervical spine, initial encounter: Secondary | ICD-10-CM | POA: Diagnosis not present

## 2016-01-04 DIAGNOSIS — S29012A Strain of muscle and tendon of back wall of thorax, initial encounter: Secondary | ICD-10-CM | POA: Diagnosis not present

## 2016-01-04 DIAGNOSIS — S134XXA Sprain of ligaments of cervical spine, initial encounter: Secondary | ICD-10-CM | POA: Diagnosis not present

## 2016-01-04 DIAGNOSIS — S39012A Strain of muscle, fascia and tendon of lower back, initial encounter: Secondary | ICD-10-CM | POA: Diagnosis not present

## 2016-01-04 DIAGNOSIS — M546 Pain in thoracic spine: Secondary | ICD-10-CM | POA: Diagnosis not present

## 2016-04-06 ENCOUNTER — Ambulatory Visit: Payer: BLUE CROSS/BLUE SHIELD | Admitting: Family Medicine

## 2016-04-06 ENCOUNTER — Ambulatory Visit (INDEPENDENT_AMBULATORY_CARE_PROVIDER_SITE_OTHER): Payer: BLUE CROSS/BLUE SHIELD | Admitting: Family Medicine

## 2016-04-06 ENCOUNTER — Telehealth: Payer: Self-pay | Admitting: Family Medicine

## 2016-04-06 ENCOUNTER — Encounter: Payer: Self-pay | Admitting: Family Medicine

## 2016-04-06 VITALS — BP 128/83 | HR 75 | Temp 98.5°F | Ht 73.0 in | Wt 261.4 lb

## 2016-04-06 DIAGNOSIS — K580 Irritable bowel syndrome with diarrhea: Secondary | ICD-10-CM

## 2016-04-06 DIAGNOSIS — K604 Rectal fistula: Secondary | ICD-10-CM | POA: Diagnosis not present

## 2016-04-06 DIAGNOSIS — K589 Irritable bowel syndrome without diarrhea: Secondary | ICD-10-CM | POA: Insufficient documentation

## 2016-04-06 MED ORDER — HYDROCORTISONE ACETATE 25 MG RE SUPP: 25.0000 mg | Freq: Two times a day (BID) | RECTAL | 0 refills | Status: DC

## 2016-04-06 MED ORDER — CIPROFLOXACIN HCL 500 MG PO TABS: 500.0000 mg | ORAL_TABLET | Freq: Two times a day (BID) | ORAL | 0 refills | Status: DC

## 2016-04-06 MED ORDER — ELUXADOLINE 100 MG PO TABS: 100.0000 mg | ORAL_TABLET | Freq: Two times a day (BID) | ORAL | 1 refills | Status: DC

## 2016-04-06 NOTE — Progress Notes (Signed)
BP 128/83 (BP Location: Right Arm, Patient Position: Sitting, Cuff Size: Normal)   Pulse 75   Temp 98.5 F (36.9 C) (Oral)   Ht 6\' 1"  (1.854 m)   Wt 261 lb 6.4 oz (118.6 kg)   BMI 34.49 kg/m    Subjective:    Patient ID: Alexander Fowler, male    DOB: May 20, 1981, 35 y.o.   MRN: [id number]  HPI: Alexander Fowler is a 35 y.o. male presenting on 04/06/2016 for fistual   HPI Rectal drainage and pain Patient has had rectal drainage and tenderness specifically on the right side that's been going on for at least a week now. He has had rectal fistulas twice in his history. They were both treated initially medically but then one had to be fixed surgically. He denies any fevers or chills. He says he does have very frequent bowel movements at least 4 times a day on a regular basis that's been going on since he was 13. The drainage has been coming out of his rectum has been thicker and white in nature. He denies any blood in his stool but has had a little bit of blood when he wipes. He is also had hemorrhoids previously.  Relevant past medical, surgical, family and social history reviewed and updated as indicated. Interim medical history since our last visit reviewed. Allergies and medications reviewed and updated.  Review of Systems  Constitutional: Negative for fever.  HENT: Negative for ear discharge and ear pain.   Eyes: Negative for discharge and visual disturbance.  Respiratory: Negative for shortness of breath and wheezing.   Cardiovascular: Negative for chest pain and leg swelling.  Gastrointestinal: Positive for anal bleeding, diarrhea and rectal pain. Negative for abdominal pain, blood in stool, constipation, nausea and vomiting.  Genitourinary: Negative for difficulty urinating.  Musculoskeletal: Negative for back pain and gait problem.  Skin: Negative for rash.  Neurological: Negative for syncope, light-headedness and headaches.  All other systems reviewed and are  negative.   Per HPI unless specifically indicated above     Medication List       Accurate as of 04/06/16 10:16 AM. Always use your most recent med list.          ciprofloxacin 500 MG tablet Commonly known as:  CIPRO Take 1 tablet (500 mg total) by mouth 2 (two) times daily.   Eluxadoline 100 MG Tabs Commonly known as:  VIBERZI Take 100 mg by mouth 2 (two) times daily.   hydrocortisone 25 MG suppository Commonly known as:  ANUSOL-HC Place 1 suppository (25 mg total) rectally 2 (two) times daily.   pantoprazole 40 MG tablet Commonly known as:  PROTONIX Take 1 tablet (40 mg total) by mouth daily.   zolpidem 10 MG tablet Commonly known as:  AMBIEN Take 1 tablet (10 mg total) by mouth at bedtime.          Objective:    BP 128/83 (BP Location: Right Arm, Patient Position: Sitting, Cuff Size: Normal)   Pulse 75   Temp 98.5 F (36.9 C) (Oral)   Ht 6\' 1"  (1.854 m)   Wt 261 lb 6.4 oz (118.6 kg)   BMI 34.49 kg/m   Wt Readings from Last 3 Encounters:  04/06/16 261 lb 6.4 oz (118.6 kg)  11/15/15 239 lb (108.4 kg)  02/15/15 242 lb (109.8 kg)    Physical Exam  Constitutional: He is oriented to person, place, and time. He appears well-developed and well-nourished. No distress.  Eyes: Conjunctivae and  EOM are normal. Pupils are equal, round, and reactive to light. Right eye exhibits no discharge. No scleral icterus.  Neck: Neck supple. No thyromegaly present.  Cardiovascular: Normal rate, regular rhythm, normal heart sounds and intact distal pulses.   No murmur heard. Pulmonary/Chest: Effort normal and breath sounds normal. No respiratory distress. He has no wheezes.  Abdominal: Soft. Bowel sounds are normal. He exhibits no distension. There is no tenderness. There is no rebound and no guarding.  Genitourinary: Prostate normal. Rectal exam shows external hemorrhoid, fissure and tenderness. Rectal exam shows no internal hemorrhoid, no mass and anal tone normal.   Musculoskeletal: Normal range of motion. He exhibits no edema.  Lymphadenopathy:    He has no cervical adenopathy.  Neurological: He is alert and oriented to person, place, and time. Coordination normal.  Skin: Skin is warm and dry. No rash noted. He is not diaphoretic.  Psychiatric: He has a normal mood and affect. His behavior is normal.  Nursing note and vitals reviewed.       Assessment & Plan:   Problem List Items Addressed This Visit      Digestive   Irritable bowel syndrome   Relevant Medications   Eluxadoline (VIBERZI) 100 MG TABS    Other Visit Diagnoses    Rectal fistula    -  Primary   Concern for small rectal fistula will try antibiotic and steroid cream see if it'll go away on its own, if not then will refer to colorectal   Relevant Medications   ciprofloxacin (CIPRO) 500 MG tablet   hydrocortisone (ANUSOL-HC) 25 MG suppository       Follow up plan: Return in about 2 months (around 06/06/2016), or if symptoms worsen or fail to improve, for Follow-up IBS.  Counseling provided for all of the vaccine components No orders of the defined types were placed in this encounter.   Arville Care, MD [facility] 04/06/2016, 10:16 AM

## 2016-04-09 MED ORDER — HYDROCORTISONE 2.5 % RE CREA: 1.0000 "application " | TOPICAL_CREAM | Freq: Two times a day (BID) | RECTAL | 0 refills | Status: DC

## 2016-04-09 NOTE — Telephone Encounter (Signed)
I was just trying to send hydrocortisone preparation H Like cream. I don't know what ended up getting sent but just have them give him whatever kind of rectal hydrocortisone is cheapest.

## 2016-04-09 NOTE — Telephone Encounter (Signed)
Pt aware and cream sent in

## 2016-04-18 ENCOUNTER — Ambulatory Visit (INDEPENDENT_AMBULATORY_CARE_PROVIDER_SITE_OTHER): Payer: BLUE CROSS/BLUE SHIELD | Admitting: Family Medicine

## 2016-04-18 ENCOUNTER — Encounter: Payer: Self-pay | Admitting: Family Medicine

## 2016-04-18 VITALS — BP 120/84 | HR 88 | Temp 97.2°F | Ht 72.0 in | Wt 266.4 lb

## 2016-04-18 DIAGNOSIS — K6289 Other specified diseases of anus and rectum: Secondary | ICD-10-CM | POA: Diagnosis not present

## 2016-04-18 DIAGNOSIS — K644 Residual hemorrhoidal skin tags: Secondary | ICD-10-CM

## 2016-04-18 MED ORDER — MESALAMINE 1000 MG RE SUPP: 1000.0000 mg | Freq: Two times a day (BID) | RECTAL | 0 refills | Status: DC

## 2016-04-18 NOTE — Progress Notes (Signed)
Subjective:  Patient ID: Alexander Fowler, male    DOB: 06-Apr-1981  Age: 35 y.o. MRN: [id number]  CC: Hemorrhoids (x 2-3 days)   HPI Alexander Fowler presents for Intense itching and discomfort at his rectal region. He's been using the hemorrhoid cream given to him by Dr. Louanne Skyeettinger on the fourth. It helps for about an hour but unfortunately wears often leaves him with intense discomfort. He took off from work for a couple of days. Today he tried to go back. 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 He states that the drainage he was experiencing at his last visit has cleared with use of the antibiotic offered by Dr. Louanne Skyeettinger.   History Alexander Fowler has a past medical history of GERD (gastroesophageal reflux disease) and Insomnia.   He has a past surgical history that includes Vasectomy.   His family history includes Heart disease in his mother; Hypertension in his mother.He reports that he has never smoked. He has never used smokeless tobacco. He reports that he drinks alcohol. He reports that he does not use drugs.    Medication List       Accurate as of 04/18/16 12:44 PM. Always use your most recent med list.          Eluxadoline 100 MG Tabs Commonly known as:  VIBERZI Take 100 mg by mouth 2 (two) times daily.   hydrocortisone 2.5 % rectal cream Commonly known as:  ANUSOL-HC Place 1 application rectally 2 (two) times daily.   mesalamine 1000 MG suppository Commonly known as:  CANASA Place 1 suppository (1,000 mg total) rectally 2 (two) times daily.   pantoprazole 40 MG tablet Commonly known as:  PROTONIX Take 1 tablet (40 mg total) by mouth daily.   zolpidem 10 MG tablet Commonly known as:  AMBIEN Take 1 tablet (10 mg total) by mouth at bedtime.        ROS Review of Systems  Constitutional: Negative for chills, diaphoresis and fever.  Gastrointestinal: Positive for blood in stool and  rectal pain. Negative for abdominal distention, abdominal pain, constipation, diarrhea, nausea and vomiting.  Genitourinary: Negative for dysuria and hematuria.  Musculoskeletal: Negative for arthralgias and joint swelling.  Skin: Negative for rash.    Objective:  BP 120/84 (BP Location: Left Arm, Patient Position: Sitting, Cuff Size: Large)   Pulse 88   Temp 97.2 F (36.2 C) (Oral)   Ht 6' (1.829 m)   Wt 266 lb 6.4 oz (120.8 kg)   SpO2 97%   BMI 36.13 kg/m   BP Readings from Last 3 Encounters:  04/18/16 120/84  04/06/16 128/83  11/15/15 128/87    Wt Readings from Last 3 Encounters:  04/18/16 266 lb 6.4 oz (120.8 kg)  04/06/16 261 lb 6.4 oz (118.6 kg)  11/15/15 239 lb (108.4 kg)     Physical Exam  Constitutional: He is oriented to person, place, and time. He appears well-developed and well-nourished.  HENT:  Head: Normocephalic and atraumatic.  Right Ear: External ear normal.  Left Ear: External ear normal.  Mouth/Throat: No oropharyngeal exudate or posterior oropharyngeal erythema.  Eyes: Pupils are equal, round, and reactive to light.  Neck: Normal range of motion. Neck supple.  Cardiovascular: Normal rate and regular rhythm.   No murmur heard. Pulmonary/Chest: Breath sounds normal. No respiratory distress.  Genitourinary: Prostate normal. Rectal exam shows external hemorrhoid and internal hemorrhoid (with irregular surface and proctitis changes). Rectal  exam shows no fissure, no mass, anal tone normal and guaiac negative stool.  Neurological: He is alert and oriented to person, place, and time.  Vitals reviewed.    Lab Results  Component Value Date   WBC 7.2 02/15/2015   HGB 16.5 02/15/2015   HCT 50.0 02/15/2015   GLUCOSE 75 11/15/2015   ALT 31 11/15/2015   AST 31 11/15/2015   NA 142 11/15/2015   K 4.3 11/15/2015   CL 99 11/15/2015   CREATININE 0.99 11/15/2015   BUN 13 11/15/2015   CO2 28 11/15/2015   HGBA1C 5.1 02/15/2015    [place] Scrotum  Result  Date: 07/24/2013 CLINICAL DATA:  Scrotal swelling, worse on the left. EXAM: SCROTAL ULTRASOUND DOPPLER ULTRASOUND OF THE TESTICLES TECHNIQUE: Complete ultrasound examination of the testicles, epididymis, and other scrotal structures was performed. Color and spectral Doppler ultrasound were also utilized to evaluate blood flow to the testicles. COMPARISON:  None. FINDINGS: Right testicle Measurements: 5.3 x 2.8 x 3.7 cm. No mass or microlithiasis visualized. Left testicle Measurements: 5.5 x 2.9 x 3.5 cm. A single tiny microcalcification is identified but there is no microlithiasis. No mass is present. Right epididymis:  Normal in size and appearance. Left epididymis:  Normal in size and appearance. Hydrocele:  Very small bilateral hydroceles are identified. Varicocele:  None visualized. Pulsed Doppler interrogation of both testes demonstrates low resistance arterial and venous waveforms bilaterally. IMPRESSION: No acute or focal abnormality. Very small bilateral hydroceles are noted. Electronically Signed   By: Drusilla Kannerhomas  Dalessio M.D.   On: 07/24/2013 15:36   [place] Art/ven Flow Abd Pelv Doppler  Result Date: 07/24/2013 CLINICAL DATA:  Scrotal swelling, worse on the left. EXAM: SCROTAL ULTRASOUND DOPPLER ULTRASOUND OF THE TESTICLES TECHNIQUE: Complete ultrasound examination of the testicles, epididymis, and other scrotal structures was performed. Color and spectral Doppler ultrasound were also utilized to evaluate blood flow to the testicles. COMPARISON:  None. FINDINGS: Right testicle Measurements: 5.3 x 2.8 x 3.7 cm. No mass or microlithiasis visualized. Left testicle Measurements: 5.5 x 2.9 x 3.5 cm. A single tiny microcalcification is identified but there is no microlithiasis. No mass is present. Right epididymis:  Normal in size and appearance. Left epididymis:  Normal in size and appearance. Hydrocele:  Very small bilateral hydroceles are identified. Varicocele:  None visualized. Pulsed Doppler interrogation  of both testes demonstrates low resistance arterial and venous waveforms bilaterally. IMPRESSION: No acute or focal abnormality. Very small bilateral hydroceles are noted. Electronically Signed   By: Drusilla Kannerhomas  Dalessio M.D.   On: 07/24/2013 15:36    Assessment & Plan:   Alexander Fowler was seen today for hemorrhoids.  Diagnoses and all orders for this visit:  Proctitis  Residual hemorrhoidal skin tags  Other orders -     mesalamine (CANASA) 1000 MG suppository; Place 1 suppository (1,000 mg total) rectally 2 (two) times daily.     I have discontinued Alexander Fowler's ciprofloxacin. I am also having him start on mesalamine. Additionally, I am having him maintain his pantoprazole, zolpidem, Eluxadoline, and hydrocortisone.  Meds ordered this encounter  Medications  . mesalamine (CANASA) 1000 MG suppository    Sig: Place 1 suppository (1,000 mg total) rectally 2 (two) times daily.    Dispense:  60 suppository    Refill:  0     Follow-up: Return if symptoms worsen or fail to improve.  Mechele ClaudeWarren Kao Berkheimer, M.D.

## 2016-06-08 ENCOUNTER — Ambulatory Visit: Payer: BLUE CROSS/BLUE SHIELD | Admitting: Family Medicine

## 2016-06-08 ENCOUNTER — Ambulatory Visit: Payer: BLUE CROSS/BLUE SHIELD | Admitting: Family

## 2016-06-11 ENCOUNTER — Encounter: Payer: Self-pay | Admitting: Family Medicine

## 2016-06-18 ENCOUNTER — Other Ambulatory Visit: Payer: Self-pay | Admitting: Family Medicine

## 2016-06-18 MED ORDER — ZOLPIDEM TARTRATE 10 MG PO TABS: 10.0000 mg | ORAL_TABLET | Freq: Every day | ORAL | 3 refills | Status: DC

## 2016-06-18 NOTE — Telephone Encounter (Signed)
Refill called to [facility] 

## 2016-07-20 ENCOUNTER — Other Ambulatory Visit: Payer: Self-pay | Admitting: Family Medicine

## 2016-07-20 DIAGNOSIS — K580 Irritable bowel syndrome with diarrhea: Secondary | ICD-10-CM

## 2016-07-30 ENCOUNTER — Other Ambulatory Visit: Payer: Self-pay | Admitting: Family Medicine

## 2016-07-30 DIAGNOSIS — K580 Irritable bowel syndrome with diarrhea: Secondary | ICD-10-CM

## 2016-08-20 ENCOUNTER — Other Ambulatory Visit: Payer: Self-pay | Admitting: Family Medicine

## 2016-08-20 DIAGNOSIS — K580 Irritable bowel syndrome with diarrhea: Secondary | ICD-10-CM

## 2016-08-21 ENCOUNTER — Telehealth: Payer: Self-pay

## 2016-08-21 DIAGNOSIS — S134XXA Sprain of ligaments of cervical spine, initial encounter: Secondary | ICD-10-CM | POA: Diagnosis not present

## 2016-08-21 DIAGNOSIS — S29012A Strain of muscle and tendon of back wall of thorax, initial encounter: Secondary | ICD-10-CM | POA: Diagnosis not present

## 2016-08-21 DIAGNOSIS — S39012A Strain of muscle, fascia and tendon of lower back, initial encounter: Secondary | ICD-10-CM | POA: Diagnosis not present

## 2016-08-21 DIAGNOSIS — M546 Pain in thoracic spine: Secondary | ICD-10-CM | POA: Diagnosis not present

## 2016-08-21 DIAGNOSIS — K580 Irritable bowel syndrome with diarrhea: Secondary | ICD-10-CM

## 2016-08-21 MED ORDER — ELUXADOLINE 100 MG PO TABS: 1.0000 | ORAL_TABLET | Freq: Two times a day (BID) | ORAL | 5 refills | Status: DC

## 2016-08-21 NOTE — Telephone Encounter (Signed)
Okay at current level for 6 mos. Thanks ws 

## 2016-08-21 NOTE — Telephone Encounter (Signed)
Patient last seen 04/18/2016, wants refill of Viberzi.  Please advise and route to pools

## 2016-08-22 ENCOUNTER — Ambulatory Visit (INDEPENDENT_AMBULATORY_CARE_PROVIDER_SITE_OTHER): Payer: BLUE CROSS/BLUE SHIELD | Admitting: Family

## 2016-08-22 ENCOUNTER — Encounter: Payer: Self-pay | Admitting: Family

## 2016-08-22 ENCOUNTER — Ambulatory Visit (INDEPENDENT_AMBULATORY_CARE_PROVIDER_SITE_OTHER): Payer: BLUE CROSS/BLUE SHIELD

## 2016-08-22 VITALS — BP 115/82 | HR 102 | Temp 97.7°F | Ht 72.0 in | Wt 266.0 lb

## 2016-08-22 DIAGNOSIS — S39012A Strain of muscle, fascia and tendon of lower back, initial encounter: Secondary | ICD-10-CM

## 2016-08-22 DIAGNOSIS — S134XXA Sprain of ligaments of cervical spine, initial encounter: Secondary | ICD-10-CM | POA: Diagnosis not present

## 2016-08-22 DIAGNOSIS — M545 Low back pain, unspecified: Secondary | ICD-10-CM

## 2016-08-22 DIAGNOSIS — M546 Pain in thoracic spine: Secondary | ICD-10-CM | POA: Diagnosis not present

## 2016-08-22 DIAGNOSIS — S29012A Strain of muscle and tendon of back wall of thorax, initial encounter: Secondary | ICD-10-CM | POA: Diagnosis not present

## 2016-08-22 MED ORDER — METHYLPREDNISOLONE ACETATE 80 MG/ML IJ SUSP
80.0000 mg | Freq: Once | INTRAMUSCULAR | Status: AC
  Administered 2016-08-22: 80 mg via INTRAMUSCULAR

## 2016-08-22 MED ORDER — NAPROXEN 500 MG PO TABS: 500.0000 mg | ORAL_TABLET | Freq: Two times a day (BID) | ORAL | 1 refills | Status: DC

## 2016-08-22 MED ORDER — CYCLOBENZAPRINE HCL 10 MG PO TABS: 10.0000 mg | ORAL_TABLET | Freq: Three times a day (TID) | ORAL | 1 refills | Status: DC | PRN

## 2016-08-22 MED ORDER — KETOROLAC TROMETHAMINE 60 MG/2ML IM SOLN
60.0000 mg | Freq: Once | INTRAMUSCULAR | Status: AC
  Administered 2016-08-22: 60 mg via INTRAMUSCULAR

## 2016-08-22 NOTE — Telephone Encounter (Signed)
This is a controlled substance so we'll have to printed out and have him come pick it up.

## 2016-08-22 NOTE — Patient Instructions (Signed)
Low Back Sprain Rehab  Ask your health care provider which exercises are safe for you. Do exercises exactly as told by your health care provider and adjust them as directed. It is normal to feel mild stretching, pulling, tightness, or discomfort as you do these exercises, but you should stop right away if you feel sudden pain or your pain gets worse. Do not begin these exercises until told by your health care provider.  Stretching and range of motion exercises  These exercises warm up your muscles and joints and improve the movement and flexibility of your back. These exercises also help to relieve pain, numbness, and tingling.  Exercise A: Lumbar rotation     1. Lie on your back on a firm surface and bend your knees.  2. Straighten your arms out to your sides so each arm forms an "L" shape with a side of your body (a 90 degree angle).  3. Slowly move both of your knees to one side of your body until you feel a stretch in your lower back. Try not to let your shoulders move off of the floor.  4. Hold for __________ seconds.  5. Tense your abdominal muscles and slowly move your knees back to the starting position.  6. Repeat this exercise on the other side of your body.  Repeat __________ times. Complete this exercise __________ times a day.  Exercise B: Prone extension on elbows     1. Lie on your abdomen on a firm surface.  2. Prop yourself up on your elbows.  3. Use your arms to help lift your chest up until you feel a gentle stretch in your abdomen and your lower back.  ? This will place some of your body weight on your elbows. If this is uncomfortable, try stacking pillows under your chest.  ? Your hips should stay down, against the surface that you are lying on. Keep your hip and back muscles relaxed.  4. Hold for __________ seconds.  5. Slowly relax your upper body and return to the starting position.  Repeat __________ times. Complete this exercise __________ times a day.  Strengthening exercises  These  exercises build strength and endurance in your back. Endurance is the ability to use your muscles for a long time, even after they get tired.  Exercise C: Pelvic tilt   1. Lie on your back on a firm surface. Bend your knees and keep your feet flat.  2. Tense your abdominal muscles. Tip your pelvis up toward the ceiling and flatten your lower back into the floor.  ? To help with this exercise, you may place a small towel under your lower back and try to push your back into the towel.  3. Hold for __________ seconds.  4. Let your muscles relax completely before you repeat this exercise.  Repeat __________ times. Complete this exercise __________ times a day.  Exercise D: Alternating arm and leg raises     1. Get on your hands and knees on a firm surface. If you are on a hard floor, you may want to use padding to cushion your knees, such as an exercise mat.  2. Line up your arms and legs. Your hands should be below your shoulders, and your knees should be below your hips.  3. Lift your left leg behind you. At the same time, raise your right arm and straighten it in front of you.  ? Do not lift your leg higher than your hip.  ? Do   not lift your arm higher than your shoulder.  ? Keep your abdominal and back muscles tight.  ? Keep your hips facing the ground.  ? Do not arch your back.  ? Keep your balance carefully, and do not hold your breath.  4. Hold for __________ seconds.  5. Slowly return to the starting position and repeat with your right leg and your left arm.  Repeat __________ times. Complete this exercise __________ times a day.  Exercise E: Abdominal set with straight leg raise     1. Lie on your back on a firm surface.  2. Bend one of your knees and keep your other leg straight.  3. Tense your abdominal muscles and lift your straight leg up, 4-6 inches (10-15 cm) off the ground.  4. Keep your abdominal muscles tight and hold for __________ seconds.  ? Do not hold your breath.  ? Do not arch your back. Keep it  flat against the ground.  5. Keep your abdominal muscles tense as you slowly lower your leg back to the starting position.  6. Repeat with your other leg.  Repeat __________ times. Complete this exercise __________ times a day.  Posture and body mechanics     Body mechanics refers to the movements and positions of your body while you do your daily activities. Posture is part of body mechanics. Good posture and healthy body mechanics can help to relieve stress in your body's tissues and joints. Good posture means that your spine is in its natural S-curve position (your spine is neutral), your shoulders are pulled back slightly, and your head is not tipped forward. The following are general guidelines for applying improved posture and body mechanics to your everyday activities.  Standing     · When standing, keep your spine neutral and your feet about hip-width apart. Keep a slight bend in your knees. Your ears, shoulders, and hips should line up.  · When you do a task in which you stand in one place for a long time, place one foot up on a stable object that is 2-4 inches (5-10 cm) high, such as a footstool. This helps keep your spine neutral.  Sitting     · When sitting, keep your spine neutral and keep your feet flat on the floor. Use a footrest, if necessary, and keep your thighs parallel to the floor. Avoid rounding your shoulders, and avoid tilting your head forward.  · When working at a desk or a computer, keep your desk at a height where your hands are slightly lower than your elbows. Slide your chair under your desk so you are close enough to maintain good posture.  · When working at a computer, place your monitor at a height where you are looking straight ahead and you do not have to tilt your head forward or downward to look at the screen.  Resting     · When lying down and resting, avoid positions that are most painful for you.  · If you have pain with activities such as sitting, bending, stooping, or  squatting (flexion-based activities), lie in a position in which your body does not bend very much. For example, avoid curling up on your side with your arms and knees near your chest (fetal position).  · If you have pain with activities such as standing for a long time or reaching with your arms (extension-based activities), lie with your spine in a neutral position and bend your knees slightly. Try the following   positions:  · Lying on your side with a pillow between your knees.  · Lying on your back with a pillow under your knees.  Lifting     · When lifting objects, keep your feet at least shoulder-width apart and tighten your abdominal muscles.  · Bend your knees and hips and keep your spine neutral. It is important to lift using the strength of your legs, not your back. Do not lock your knees straight out.  · Always ask for help to lift heavy or awkward objects.  This information is not intended to replace advice given to you by your health care provider. Make sure you discuss any questions you have with your health care provider.  Document Released: 08/20/2005 Document Revised: 04/26/2016 Document Reviewed: 06/01/2015  Elsevier Interactive Patient Education © 2017 [employer]

## 2016-08-22 NOTE — Telephone Encounter (Signed)
Patient informed that prescription is ready for pick up and placed at front desk

## 2016-08-22 NOTE — Progress Notes (Signed)
Subjective:    Patient ID: Alexander Fowler, male    DOB: Dec 09, 1980, 35 y.o.   MRN: [id number]  Back Pain  This is a recurrent problem. The current episode started in the past 7 days. The problem occurs constantly. The problem has been gradually worsening since onset. The pain is present in the lumbar spine. The quality of the pain is described as aching. The pain radiates to the left thigh. The pain is at a severity of 10/10. The pain is severe. The symptoms are aggravated by bending and standing. Associated symptoms include leg pain and weakness. Pertinent negatives include no bladder incontinence, bowel incontinence, chest pain, dysuria, fever, numbness or tingling. Risk factors include sedentary lifestyle. He has tried bed rest, ice and NSAIDs for the symptoms. The treatment provided mild relief.      Review of Systems  Constitutional: Negative for fever.  Cardiovascular: Negative for chest pain.  Gastrointestinal: Negative for bowel incontinence.  Genitourinary: Negative for bladder incontinence and dysuria.  Musculoskeletal: Positive for back pain.  Neurological: Positive for weakness. Negative for tingling and numbness.  All other systems reviewed and are negative.      Objective:   Physical Exam  Constitutional: He is oriented to person, place, and time. He appears well-developed and well-nourished. No distress.  Eyes: Pupils are equal, round, and reactive to light. Right eye exhibits no discharge. Left eye exhibits no discharge.  Neck: Normal range of motion. Neck supple. No thyromegaly present.  Cardiovascular: Normal rate, regular rhythm, normal heart sounds and intact distal pulses.   No murmur heard. Pulmonary/Chest: Effort normal and breath sounds normal. No respiratory distress. He has no wheezes.  Abdominal: Soft. Bowel sounds are normal. He exhibits no distension. There is no tenderness.  Musculoskeletal: Normal range of motion. He exhibits tenderness (left lower  back tenderness with palpation). He exhibits no edema.  Pain in lower back with movement  Neurological: He is alert and oriented to person, place, and time.  Skin: Skin is warm and dry. No rash noted. No erythema.  Psychiatric: He has a normal mood and affect. His behavior is normal. Judgment and thought content normal.  Vitals reviewed.  X-Ray- negative Preliminary reading by Jannifer Rodneyhristy Ritik Stavola, FNP [facility]   BP 115/82   Pulse (!) 102   Temp 97.7 F (36.5 C) (Oral)   Ht 6' (1.829 m)   Wt 266 lb (120.7 kg)   BMI 36.08 kg/m       Assessment & Plan:  1. Acute midline low back pain without sciatica - DG Lumbar Spine 2-3 Views; Future - cyclobenzaprine (FLEXERIL) 10 MG tablet; Take 1 tablet (10 mg total) by mouth 3 (three) times daily as needed for muscle spasms.  Dispense: 30 tablet; Refill: 1 - naproxen (NAPROSYN) 500 MG tablet; Take 1 tablet (500 mg total) by mouth 2 (two) times daily with a meal.  Dispense: 60 tablet; Refill: 1 - methylPREDNISolone acetate (DEPO-MEDROL) injection 80 mg; Inject 1 mL (80 mg total) into the muscle once. - ketorolac (TORADOL) injection 60 mg; Inject 2 mLs (60 mg total) into the muscle once.  2. Strain of lumbar region, initial encounter - cyclobenzaprine (FLEXERIL) 10 MG tablet; Take 1 tablet (10 mg total) by mouth 3 (three) times daily as needed for muscle spasms.  Dispense: 30 tablet; Refill: 1 - naproxen (NAPROSYN) 500 MG tablet; Take 1 tablet (500 mg total) by mouth 2 (two) times daily with a meal.  Dispense: 60 tablet; Refill: 1 - methylPREDNISolone acetate (DEPO-MEDROL)  injection 80 mg; Inject 1 mL (80 mg total) into the muscle once. - ketorolac (TORADOL) injection 60 mg; Inject 2 mLs (60 mg total) into the muscle once.  Rest Ice and Heat No other NSAID's with naproxen Exercises discussed- handout given RTO Prn   Jannifer Rodneyhristy Yevonne Yokum, FNP

## 2016-08-23 DIAGNOSIS — S29012A Strain of muscle and tendon of back wall of thorax, initial encounter: Secondary | ICD-10-CM | POA: Diagnosis not present

## 2016-08-23 DIAGNOSIS — M546 Pain in thoracic spine: Secondary | ICD-10-CM | POA: Diagnosis not present

## 2016-08-23 DIAGNOSIS — S39012A Strain of muscle, fascia and tendon of lower back, initial encounter: Secondary | ICD-10-CM | POA: Diagnosis not present

## 2016-08-23 DIAGNOSIS — S134XXA Sprain of ligaments of cervical spine, initial encounter: Secondary | ICD-10-CM | POA: Diagnosis not present

## 2016-08-31 ENCOUNTER — Telehealth: Payer: Self-pay

## 2016-08-31 NOTE — Telephone Encounter (Signed)
lmtcb

## 2016-08-31 NOTE — Telephone Encounter (Signed)
If patient's back pain is persistent then he needs to be seen, I did not see him for this issue, when my colleagues did and she says that she did not talk about MRI with him. The other route that could be done as he could be referred to orthopedic if he wanted to.

## 2016-09-20 NOTE — Telephone Encounter (Signed)
Patient will call back at a later date with decision on referral to orthopedic

## 2016-10-10 ENCOUNTER — Other Ambulatory Visit: Payer: Self-pay | Admitting: Family Medicine

## 2016-10-10 DIAGNOSIS — S29012A Strain of muscle and tendon of back wall of thorax, initial encounter: Secondary | ICD-10-CM | POA: Diagnosis not present

## 2016-10-10 DIAGNOSIS — K580 Irritable bowel syndrome with diarrhea: Secondary | ICD-10-CM

## 2016-10-10 DIAGNOSIS — S134XXA Sprain of ligaments of cervical spine, initial encounter: Secondary | ICD-10-CM | POA: Diagnosis not present

## 2016-10-10 DIAGNOSIS — S39012A Strain of muscle, fascia and tendon of lower back, initial encounter: Secondary | ICD-10-CM | POA: Diagnosis not present

## 2016-10-10 DIAGNOSIS — M546 Pain in thoracic spine: Secondary | ICD-10-CM | POA: Diagnosis not present

## 2016-10-11 ENCOUNTER — Other Ambulatory Visit: Payer: Self-pay | Admitting: *Deleted

## 2016-10-11 MED ORDER — ZOLPIDEM TARTRATE 10 MG PO TABS: 10.0000 mg | ORAL_TABLET | Freq: Every day | ORAL | 3 refills | Status: DC

## 2016-10-11 NOTE — Telephone Encounter (Signed)
Okay to call in refill, have him come see me before next refill.

## 2016-10-12 ENCOUNTER — Other Ambulatory Visit: Payer: Self-pay | Admitting: *Deleted

## 2016-10-12 DIAGNOSIS — K580 Irritable bowel syndrome with diarrhea: Secondary | ICD-10-CM

## 2016-10-12 NOTE — Telephone Encounter (Signed)
Viberzi 100mg  1 tab BID # 60 0 rf called to [facility]. This was refilled yesterday by Jannifer Rodneyhristy Hawks but the rx printed

## 2016-10-12 NOTE — Telephone Encounter (Signed)
[facility]ien called to [facility] voice mail and message left for patient.

## 2016-11-19 ENCOUNTER — Telehealth: Payer: Self-pay

## 2016-11-19 ENCOUNTER — Encounter: Payer: Self-pay | Admitting: Family Medicine

## 2016-11-19 ENCOUNTER — Ambulatory Visit (INDEPENDENT_AMBULATORY_CARE_PROVIDER_SITE_OTHER): Payer: BLUE CROSS/BLUE SHIELD | Admitting: Family Medicine

## 2016-11-19 ENCOUNTER — Telehealth: Payer: Self-pay | Admitting: Family Medicine

## 2016-11-19 ENCOUNTER — Ambulatory Visit: Payer: BLUE CROSS/BLUE SHIELD | Admitting: Family Medicine

## 2016-11-19 VITALS — BP 132/88 | HR 111 | Temp 98.3°F | Ht 72.0 in | Wt 269.0 lb

## 2016-11-19 DIAGNOSIS — S39012A Strain of muscle, fascia and tendon of lower back, initial encounter: Secondary | ICD-10-CM

## 2016-11-19 DIAGNOSIS — M5442 Lumbago with sciatica, left side: Secondary | ICD-10-CM | POA: Diagnosis not present

## 2016-11-19 DIAGNOSIS — M5441 Lumbago with sciatica, right side: Secondary | ICD-10-CM

## 2016-11-19 DIAGNOSIS — M5136 Other intervertebral disc degeneration, lumbar region: Secondary | ICD-10-CM

## 2016-11-19 DIAGNOSIS — G4700 Insomnia, unspecified: Secondary | ICD-10-CM

## 2016-11-19 DIAGNOSIS — K2100 Gastro-esophageal reflux disease with esophagitis, without bleeding: Secondary | ICD-10-CM

## 2016-11-19 DIAGNOSIS — M4180 Other forms of scoliosis, site unspecified: Secondary | ICD-10-CM

## 2016-11-19 DIAGNOSIS — K580 Irritable bowel syndrome with diarrhea: Secondary | ICD-10-CM

## 2016-11-19 DIAGNOSIS — M5116 Intervertebral disc disorders with radiculopathy, lumbar region: Secondary | ICD-10-CM | POA: Diagnosis not present

## 2016-11-19 MED ORDER — DICLOFENAC SODIUM 75 MG PO TBEC: 75.0000 mg | DELAYED_RELEASE_TABLET | Freq: Two times a day (BID) | ORAL | 5 refills | Status: DC

## 2016-11-19 MED ORDER — CYCLOBENZAPRINE HCL 10 MG PO TABS: 10.0000 mg | ORAL_TABLET | Freq: Three times a day (TID) | ORAL | 5 refills | Status: DC | PRN

## 2016-11-19 MED ORDER — PANTOPRAZOLE SODIUM 40 MG PO TBEC: 40.0000 mg | DELAYED_RELEASE_TABLET | Freq: Every day | ORAL | 11 refills | Status: DC

## 2016-11-19 MED ORDER — ZOLPIDEM TARTRATE 10 MG PO TABS: 10.0000 mg | ORAL_TABLET | Freq: Every day | ORAL | 5 refills | Status: DC

## 2016-11-19 MED ORDER — ELUXADOLINE 100 MG PO TABS: 1.0000 | ORAL_TABLET | Freq: Two times a day (BID) | ORAL | 5 refills | Status: DC

## 2016-11-19 NOTE — Patient Instructions (Signed)

## 2016-11-19 NOTE — Progress Notes (Signed)
Subjective:  Patient ID: Alexander Fowler, male    DOB: 1980-11-26  Age: 36 y.o. MRN: [id number]  CC: Back Pain (pt here today c/o back pain and wants to discuss getting an MRI on his back. )   HPI Alexander Fowler presents for back painPatient has frequent exacerbations of his lower back pain. He was here 89 days ago with similar symptoms. He says that he was brushing up higher 3 days ago and felt a pinch in the lower back. Since then he has been basically in bed. This morning he went to his chiropractor. His pain had been a dull ache that was 8 out of 10. After his adjustment the pain is reduced at 3/10. It remains there now. The pain is constant. When he sneezes or has any kind of jarring activity similar it will have a sharp component. He notes that it radiates down the right buttocks but does not go into the thigh. There is no weakness noted of the back or extremities. Patient has had a lumbar spine x-ray dated 08/22/2016 that showed lumbar levo curvature. No acute findings at that time. There was some lumbar disc narrowing noted on those films.   Also do refills on his sleeping medicine and his IBS medicine as well as his reflux medicine. Each of these problems are doing well as long as he takes the medications as per med list below.  History Alexander Fowler has a past medical history of GERD (gastroesophageal reflux disease) and Insomnia.   He has a past surgical history that includes Vasectomy.   His family history includes Heart disease in his mother; Hypertension in his mother.He reports that he has never smoked. He has never used smokeless tobacco. He reports that he drinks alcohol. He reports that he does not use drugs.  No current outpatient prescriptions on file prior to visit.   No current facility-administered medications on file prior to visit.    Allergies as of 11/19/2016   No Known Allergies     Medication List       Accurate as of 11/19/16  4:29 PM. Always use your most  recent med list.          cyclobenzaprine 10 MG tablet Commonly known as:  FLEXERIL Take 1 tablet (10 mg total) by mouth 3 (three) times daily as needed for muscle spasms.   diclofenac 75 MG EC tablet Commonly known as:  VOLTAREN Take 1 tablet (75 mg total) by mouth 2 (two) times daily. For muscle and  Joint pain   Eluxadoline 100 MG Tabs Commonly known as:  VIBERZI Take 1 tablet by mouth 2 (two) times daily.   pantoprazole 40 MG tablet Commonly known as:  PROTONIX Take 1 tablet (40 mg total) by mouth daily.   zolpidem 10 MG tablet Commonly known as:  AMBIEN Take 1 tablet (10 mg total) by mouth at bedtime.       ROS Review of Systems  Constitutional: Negative for chills, diaphoresis and fever.  HENT: Negative for sore throat.   Cardiovascular: Negative for chest pain.  Gastrointestinal: Negative for abdominal pain.  Musculoskeletal: Positive for arthralgias, back pain, gait problem and myalgias. Negative for neck pain.  Skin: Negative for rash.  Neurological: Positive for weakness. Negative for numbness.    Objective:  BP (!) 130/92   Pulse (!) 111   Temp 98.3 F (36.8 C) (Oral)   Ht 6' (1.829 m)   Wt 269 lb (122 kg)   BMI 36.48 kg/m  Physical Exam  Constitutional: He is oriented to person, place, and time. He appears well-developed and well-nourished. He appears distressed.  HENT:  Head: Normocephalic.  Eyes: Pupils are equal, round, and reactive to light.  Neck: Normal range of motion.  Cardiovascular: Normal rate, regular rhythm and normal heart sounds.   No murmur heard. Pulmonary/Chest: Effort normal and breath sounds normal.  Abdominal: There is no tenderness.  Musculoskeletal: He exhibits tenderness.  Neurological: He is alert and oriented to person, place, and time. He has normal reflexes.  Skin: Skin is warm and dry.  Psychiatric: His behavior is normal. Thought content normal.  Vitals reviewed.   Assessment & Plan:   Alexander Fowler was seen  today for back pain.  Diagnoses and all orders for this visit:  Levoscoliosis -     MR LUMBAR SPINE WO CONTRAST; Future  Degenerative disc disease, lumbar -     MR LUMBAR SPINE WO CONTRAST; Future  Acute midline low back pain with right-sided sciatica -     MR LUMBAR SPINE WO CONTRAST; Future -     cyclobenzaprine (FLEXERIL) 10 MG tablet; Take 1 tablet (10 mg total) by mouth 3 (three) times daily as needed for muscle spasms.  Strain of lumbar region, initial encounter -     MR LUMBAR SPINE WO CONTRAST; Future -     cyclobenzaprine (FLEXERIL) 10 MG tablet; Take 1 tablet (10 mg total) by mouth 3 (three) times daily as needed for muscle spasms.  Gastroesophageal reflux disease with esophagitis -     pantoprazole (PROTONIX) 40 MG tablet; Take 1 tablet (40 mg total) by mouth daily.  Irritable bowel syndrome with diarrhea -     Eluxadoline (VIBERZI) 100 MG TABS; Take 1 tablet by mouth 2 (two) times daily.  Insomnia, unspecified type  Other orders -     diclofenac (VOLTAREN) 75 MG EC tablet; Take 1 tablet (75 mg total) by mouth 2 (two) times daily. For muscle and  Joint pain -     zolpidem (AMBIEN) 10 MG tablet; Take 1 tablet (10 mg total) by mouth at bedtime.   I have discontinued Mr. Alexander Fowler's hydrocortisone, mesalamine, and naproxen. I have also changed his VIBERZI to Eluxadoline. Additionally, I am having him start on diclofenac. Lastly, I am having him maintain his cyclobenzaprine, pantoprazole, and zolpidem.  Meds ordered this encounter  Medications  . diclofenac (VOLTAREN) 75 MG EC tablet    Sig: Take 1 tablet (75 mg total) by mouth 2 (two) times daily. For muscle and  Joint pain    Dispense:  60 tablet    Refill:  5  . cyclobenzaprine (FLEXERIL) 10 MG tablet    Sig: Take 1 tablet (10 mg total) by mouth 3 (three) times daily as needed for muscle spasms.    Dispense:  30 tablet    Refill:  5  . pantoprazole (PROTONIX) 40 MG tablet    Sig: Take 1 tablet (40 mg total) by mouth  daily.    Dispense:  30 tablet    Refill:  11  . Eluxadoline (VIBERZI) 100 MG TABS    Sig: Take 1 tablet by mouth 2 (two) times daily.    Dispense:  60 tablet    Refill:  5  . zolpidem (AMBIEN) 10 MG tablet    Sig: Take 1 tablet (10 mg total) by mouth at bedtime.    Dispense:  30 tablet    Refill:  5   Lumbar stretching exercises recommended. 6 months refills of all  medications ordered.  Follow-up: Return in about 6 months (around 05/22/2017).  Mechele Claude, M.D.

## 2016-11-19 NOTE — Telephone Encounter (Signed)
Appt made to be seen for back pain

## 2016-11-20 ENCOUNTER — Other Ambulatory Visit: Payer: Self-pay | Admitting: Family Medicine

## 2016-11-20 DIAGNOSIS — K580 Irritable bowel syndrome with diarrhea: Secondary | ICD-10-CM

## 2016-11-20 DIAGNOSIS — S134XXA Sprain of ligaments of cervical spine, initial encounter: Secondary | ICD-10-CM | POA: Diagnosis not present

## 2016-11-20 DIAGNOSIS — M546 Pain in thoracic spine: Secondary | ICD-10-CM | POA: Diagnosis not present

## 2016-11-21 NOTE — Telephone Encounter (Signed)
It looks like he was seen by Dr. Darlyn Readstacks who ordered an MRI already

## 2016-12-14 ENCOUNTER — Other Ambulatory Visit: Payer: Self-pay | Admitting: Family Medicine

## 2016-12-14 DIAGNOSIS — K580 Irritable bowel syndrome with diarrhea: Secondary | ICD-10-CM

## 2016-12-17 ENCOUNTER — Other Ambulatory Visit: Payer: Self-pay | Admitting: *Deleted

## 2016-12-17 ENCOUNTER — Telehealth: Payer: Self-pay | Admitting: *Deleted

## 2016-12-17 DIAGNOSIS — K580 Irritable bowel syndrome with diarrhea: Secondary | ICD-10-CM

## 2016-12-17 NOTE — Telephone Encounter (Signed)
Patient never received Rx for Viberzi. Patient would like another Rx sent to pharmacy.

## 2016-12-17 NOTE — Telephone Encounter (Signed)
this is a controlled substance and I don't know if we can send in an electronically, we can try and see if we can phone in the refill but up not he would have to come pick up the refill

## 2016-12-18 MED ORDER — ELUXADOLINE 100 MG PO TABS: 1.0000 | ORAL_TABLET | Freq: Two times a day (BID) | ORAL | 5 refills | Status: DC

## 2016-12-18 NOTE — Telephone Encounter (Signed)
Rx called into [facility] pharmacy per Dr. Louanne Skye

## 2016-12-18 NOTE — Addendum Note (Signed)
Addended by: Caryl Bis on: 12/18/2016 08:27 AM   Modules accepted: Orders

## 2017-01-09 ENCOUNTER — Encounter: Payer: Self-pay | Admitting: Family Medicine

## 2017-01-09 ENCOUNTER — Ambulatory Visit: Payer: BLUE CROSS/BLUE SHIELD | Admitting: Family Medicine

## 2017-01-09 ENCOUNTER — Ambulatory Visit (INDEPENDENT_AMBULATORY_CARE_PROVIDER_SITE_OTHER): Payer: BLUE CROSS/BLUE SHIELD | Admitting: Family Medicine

## 2017-01-09 VITALS — BP 120/82 | HR 129 | Temp 99.1°F | Ht 72.0 in | Wt 267.0 lb

## 2017-01-09 DIAGNOSIS — A084 Viral intestinal infection, unspecified: Secondary | ICD-10-CM

## 2017-01-09 MED ORDER — ONDANSETRON 4 MG PO TBDP: 4.0000 mg | ORAL_TABLET | Freq: Three times a day (TID) | ORAL | 0 refills | Status: DC | PRN

## 2017-01-09 NOTE — Progress Notes (Signed)
BP 120/82 (BP Location: Right Arm, Patient Position: Sitting, Cuff Size: Large)   Pulse (!) 129   Temp 99.1 F (37.3 C) (Oral)   Ht 6' (1.829 m)   Wt 267 lb (121.1 kg)   BMI 36.21 kg/m    Subjective:    Patient ID: Alexander FriskBenjamin A Mcquilkin, male    DOB: 02-15-1981, 36 y.o.   MRN: [id number]  HPI: Alexander Fowler is a 36 y.o. male presenting on 01/09/2017 for GI upset (started last PM, fever, body aches, vomiting and diarrhea)   HPI Upset stomach and vomiting and fever and aches Patient has been having upset stomach and vomiting and fever and aches and diarrhea that has been going on for the past 1 day. He denies any blood in his stool or blood in his vomit. He says is been vomiting almost everything that he eats or drinks since early this morning. He denies any sick contacts that he knows of.  Relevant past medical, surgical, family and social history reviewed and updated as indicated. Interim medical history since our last visit reviewed. Allergies and medications reviewed and updated.  Review of Systems  Constitutional: Positive for chills and fever.  Respiratory: Negative for shortness of breath and wheezing.   Cardiovascular: Negative for chest pain and leg swelling.  Gastrointestinal: Positive for abdominal pain, diarrhea, nausea and vomiting. Negative for blood in stool.  Endocrine: Negative for cold intolerance and heat intolerance.  Genitourinary: Negative for decreased urine volume and difficulty urinating.  Musculoskeletal: Negative for back pain and gait problem.  Skin: Negative for rash.  All other systems reviewed and are negative.  Per HPI unless specifically indicated above     Objective:    BP 120/82 (BP Location: Right Arm, Patient Position: Sitting, Cuff Size: Large)   Pulse (!) 129   Temp 99.1 F (37.3 C) (Oral)   Ht 6' (1.829 m)   Wt 267 lb (121.1 kg)   BMI 36.21 kg/m   Wt Readings from Last 3 Encounters:  01/09/17 267 lb (121.1 kg)  11/19/16 269 lb  (122 kg)  08/22/16 266 lb (120.7 kg)    Physical Exam  Constitutional: He is oriented to person, place, and time. He appears well-developed and well-nourished. No distress.  Eyes: Conjunctivae are normal. No scleral icterus.  Cardiovascular: Normal rate, regular rhythm, normal heart sounds and intact distal pulses.   No murmur heard. Pulmonary/Chest: Effort normal and breath sounds normal. No respiratory distress. He has no wheezes.  Abdominal: Soft. Bowel sounds are normal. He exhibits no distension. There is tenderness (Diffuse mild tenderness). There is no rebound and no guarding.  Musculoskeletal: Normal range of motion. He exhibits no edema.  Neurological: He is alert and oriented to person, place, and time. Coordination normal.  Skin: Skin is warm and dry. No rash noted. He is not diaphoretic.  Psychiatric: He has a normal mood and affect. His behavior is normal.  Nursing note and vitals reviewed.     Assessment & Plan:   Problem List Items Addressed This Visit    None    Visit Diagnoses    Viral gastroenteritis    -  Primary   Relevant Medications   ondansetron (ZOFRAN ODT) 4 MG disintegrating tablet      Follow up plan: Return if symptoms worsen or fail to improve.  Counseling provided for all of the vaccine components No orders of the defined types were placed in this encounter.   Arville CareJoshua Dettinger, MD [facility]  01/09/2017, 6:18 PM

## 2017-02-26 ENCOUNTER — Telehealth: Payer: Self-pay | Admitting: Family Medicine

## 2017-02-26 NOTE — Telephone Encounter (Signed)
I called and spoke with the pharmacy and they found the refills and will fill for patient.

## 2017-02-26 NOTE — Telephone Encounter (Signed)
What is the name of the medication? Eluxadoline (VIBERZI) 100 MG TABS  Have you contacted your pharmacy to request a refill? Yes, called pharmacy 02/02/17 and still no response from us.  Which pharmacy would you like this sent to? [facility]   Patient notified that their request is being sent to the clinical staff for review and that they should receive a call once it is complete. If they do not receive a call within 24 hours they can check with their pharmacy or our office.

## 2017-03-24 ENCOUNTER — Other Ambulatory Visit: Payer: Self-pay | Admitting: Family Medicine

## 2017-03-26 NOTE — Telephone Encounter (Signed)
Rx called to pharmacy

## 2017-03-26 NOTE — Telephone Encounter (Signed)
Go ahead and call in refill for patient. 

## 2017-03-26 NOTE — Telephone Encounter (Signed)
Last seen 01/09/17  Dr Dettinger  If approved route to nurse to [facility]   [phone]

## 2017-05-08 ENCOUNTER — Ambulatory Visit (INDEPENDENT_AMBULATORY_CARE_PROVIDER_SITE_OTHER): Payer: BLUE CROSS/BLUE SHIELD | Admitting: Orthopaedic Surgery

## 2017-05-22 DIAGNOSIS — H52221 Regular astigmatism, right eye: Secondary | ICD-10-CM | POA: Diagnosis not present

## 2017-05-22 DIAGNOSIS — H5203 Hypermetropia, bilateral: Secondary | ICD-10-CM | POA: Diagnosis not present

## 2017-05-27 ENCOUNTER — Other Ambulatory Visit: Payer: Self-pay | Admitting: Family Medicine

## 2017-05-27 NOTE — Telephone Encounter (Signed)
Go ahead and call in refill for him

## 2017-05-27 NOTE — Telephone Encounter (Signed)
Rx called to pharmacy

## 2017-08-06 ENCOUNTER — Other Ambulatory Visit: Payer: Self-pay | Admitting: Family Medicine

## 2017-08-06 DIAGNOSIS — K580 Irritable bowel syndrome with diarrhea: Secondary | ICD-10-CM

## 2017-08-06 MED ORDER — ELUXADOLINE 100 MG PO TABS: 1.0000 | ORAL_TABLET | Freq: Two times a day (BID) | ORAL | 2 refills | Status: DC

## 2017-08-06 MED ORDER — ZOLPIDEM TARTRATE 10 MG PO TABS: 10.0000 mg | ORAL_TABLET | Freq: Every day | ORAL | 2 refills | Status: DC

## 2017-08-06 NOTE — Telephone Encounter (Signed)
3 month refill of Ambien and Viberzi [facility], patient aware

## 2017-08-06 NOTE — Telephone Encounter (Signed)
Go ahead and call in refills enough for 3 months 

## 2017-09-05 DIAGNOSIS — S90452A Superficial foreign body, left great toe, initial encounter: Secondary | ICD-10-CM | POA: Diagnosis not present

## 2017-09-05 DIAGNOSIS — M79672 Pain in left foot: Secondary | ICD-10-CM | POA: Diagnosis not present

## 2017-09-22 IMAGING — DX DG LUMBAR SPINE 2-3V
4 series · 4 of 4 positions shown · non-contrast
Comparison: Lumbar spine MRI 02/26/2011

CLINICAL DATA: Back pain.  Unable to stand straight.

EXAM:
LUMBAR SPINE - 2-3 VIEW

[l-spine ap (1 of 2)]
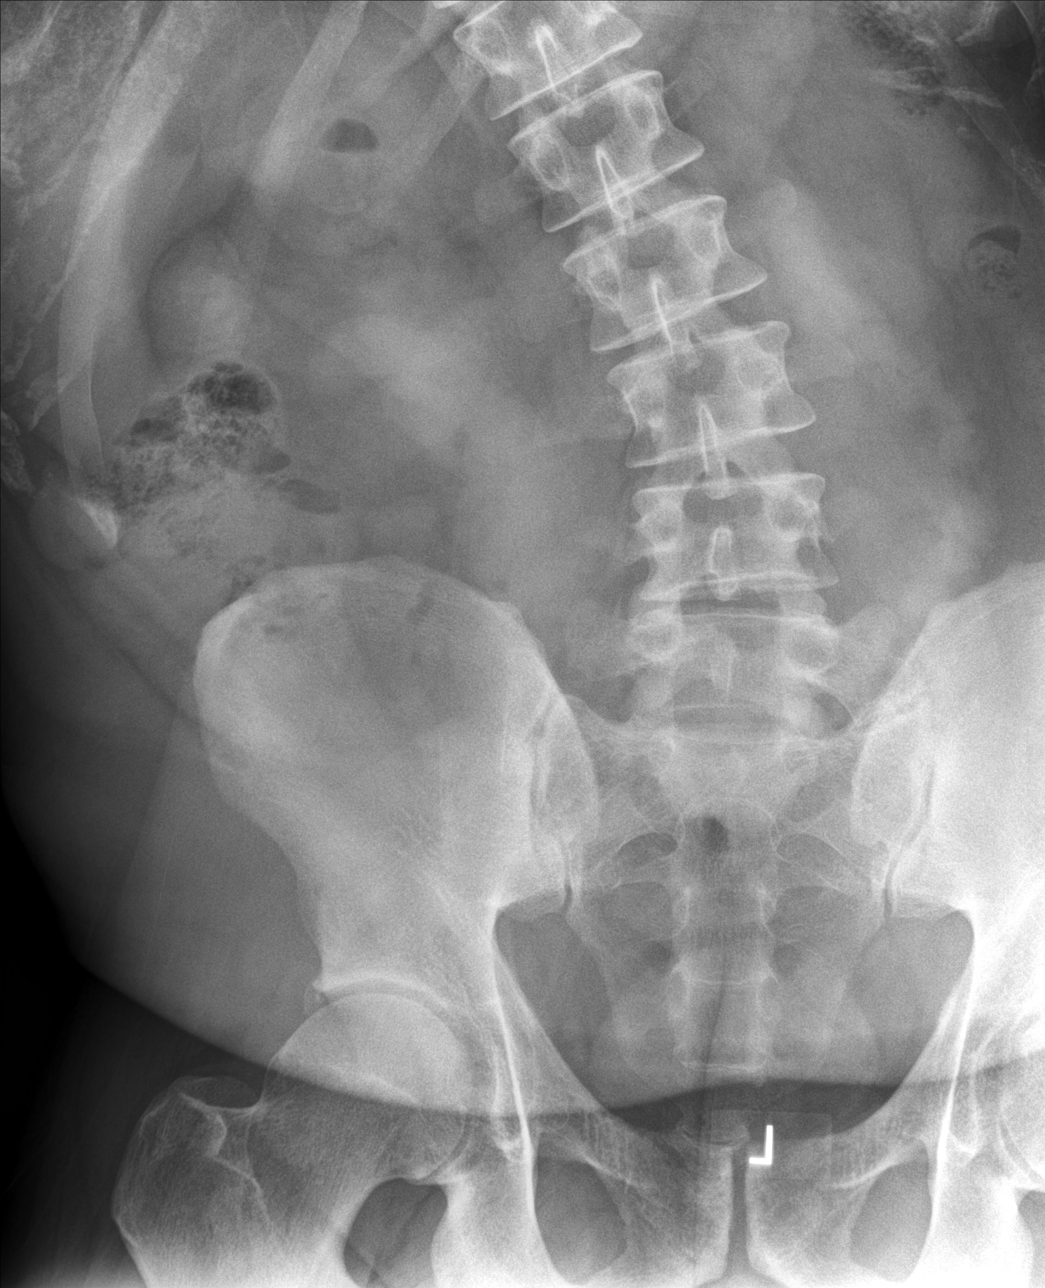

[l-spine lat (1 of 2)]
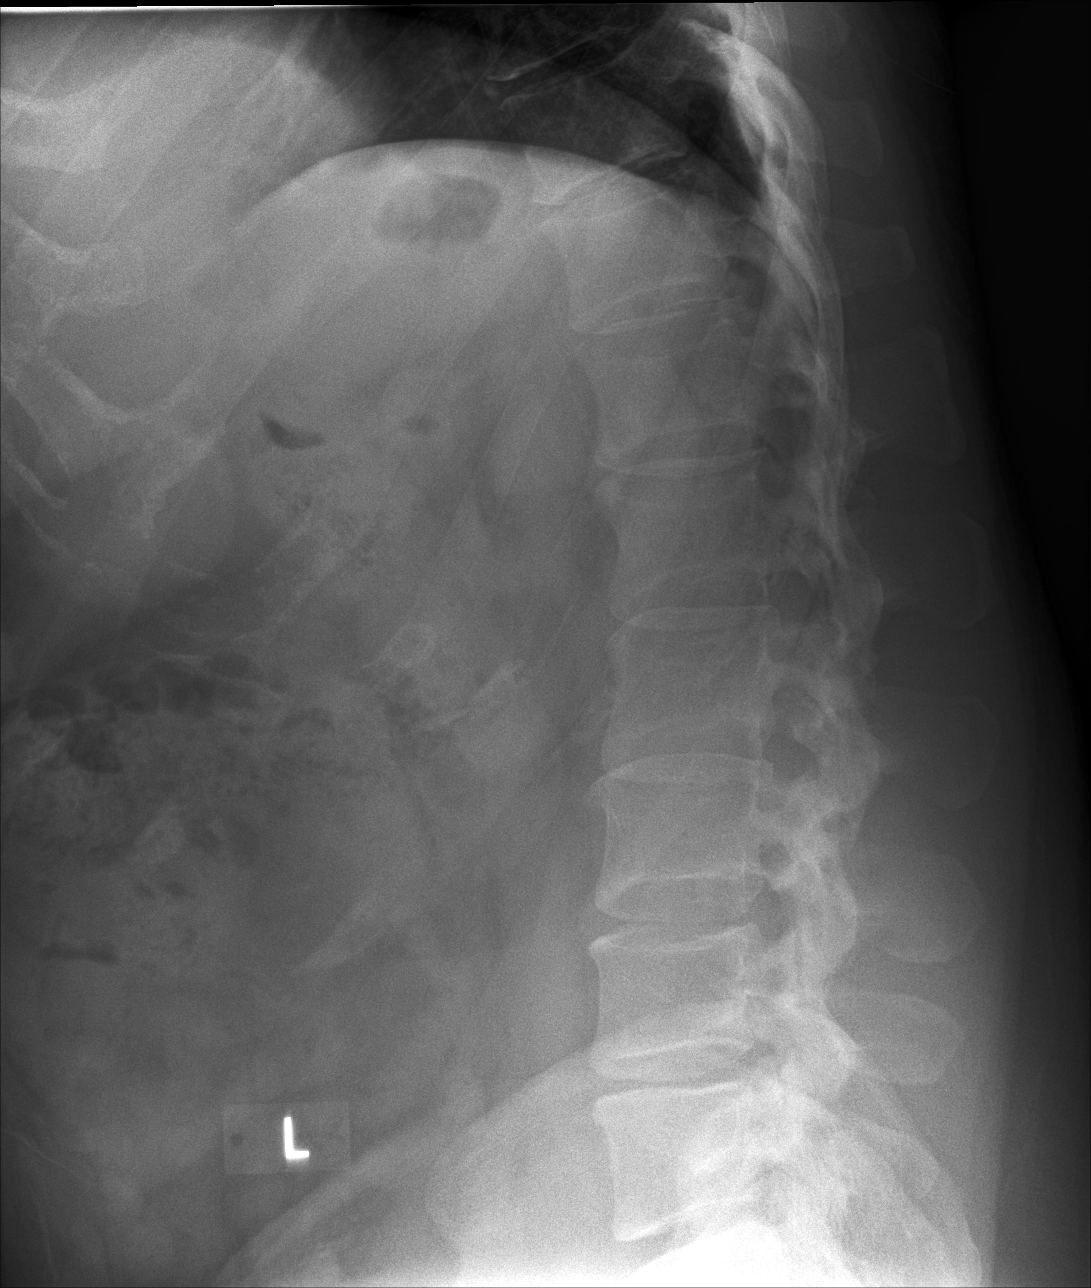

[l-spine ap (2 of 2)]
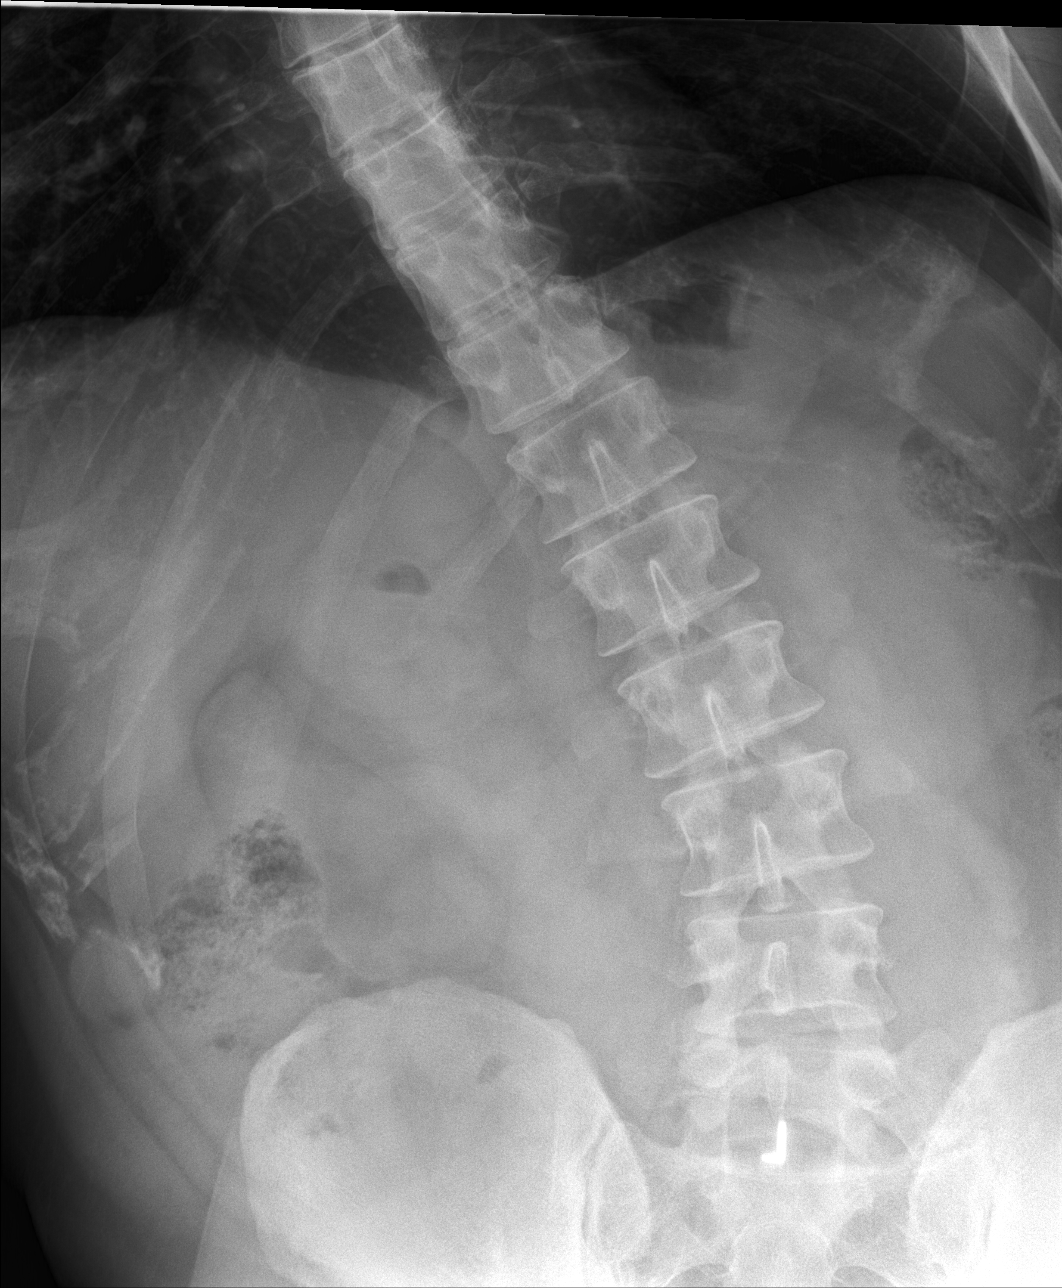

[l-spine lat (2 of 2)]
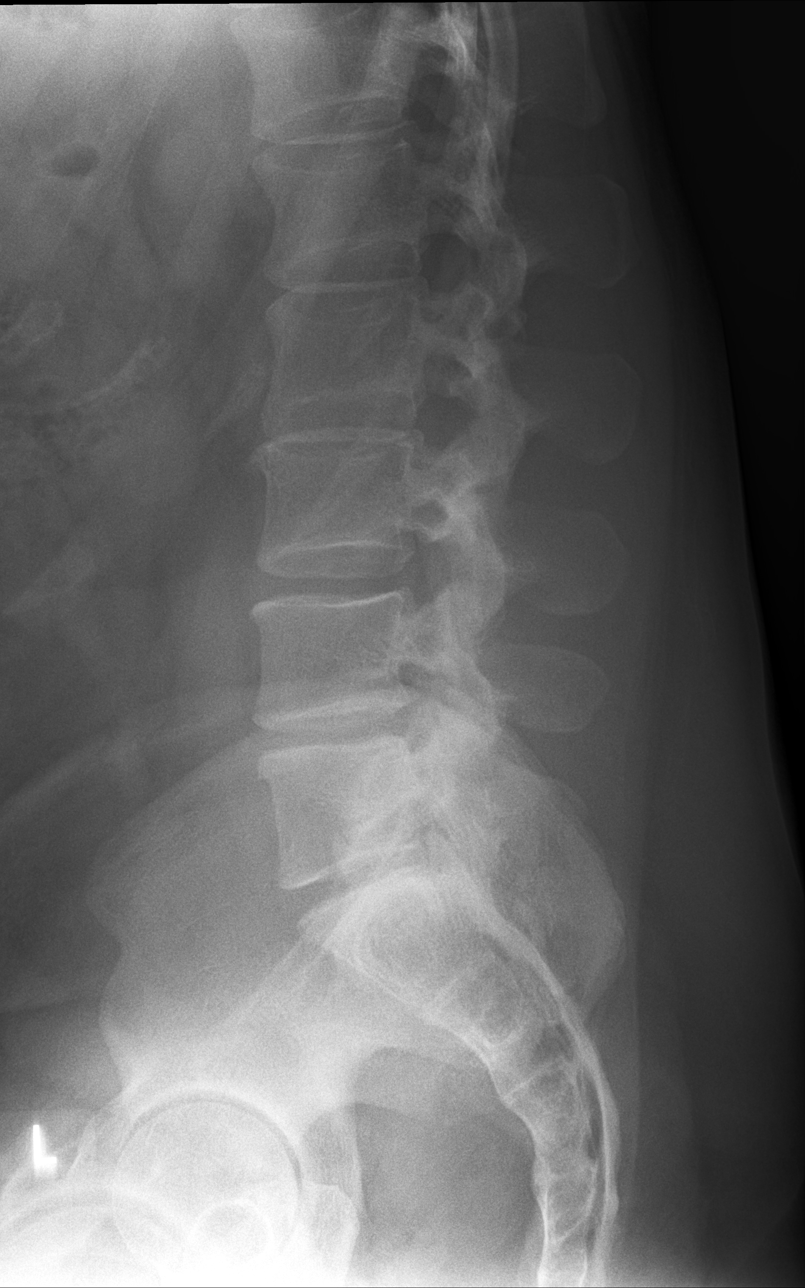

[4 of 4 positions shown; findings below may reference images not displayed]

FINDINGS: Levocurvature of the lumbar spine which may be positional given the
history and rib position. No underlying fracture, endplate erosion,
or focal bone lesion is noted. Mild L4-5 and L5-S1 disc narrowing,
also seen on comparison MRI.

There is pseudoarticulation between the L5 left transverse process
and sacrum. No superimposed spurring and no edema in this region on
comparison MRI.
IMPRESSION: 1. Lumbar levocurvature correlating with the history. No acute
osseous finding.
2. Mild lower lumbar disc narrowing.

## 2017-11-01 ENCOUNTER — Ambulatory Visit: Payer: BLUE CROSS/BLUE SHIELD | Admitting: Family Medicine

## 2017-11-01 ENCOUNTER — Encounter: Payer: Self-pay | Admitting: Family Medicine

## 2017-11-01 VITALS — BP 137/90 | HR 82 | Temp 97.0°F | Ht 72.0 in | Wt 289.0 lb

## 2017-11-01 DIAGNOSIS — M25562 Pain in left knee: Secondary | ICD-10-CM

## 2017-11-01 DIAGNOSIS — M25511 Pain in right shoulder: Secondary | ICD-10-CM

## 2017-11-01 DIAGNOSIS — G8929 Other chronic pain: Secondary | ICD-10-CM | POA: Diagnosis not present

## 2017-11-01 MED ORDER — MELOXICAM 15 MG PO TABS: 15.0000 mg | ORAL_TABLET | Freq: Every day | ORAL | 0 refills | Status: DC

## 2017-11-01 NOTE — Progress Notes (Signed)
Subjective: CC: Shoulder pain, knee pain PCP: Dettinger, Elige Radon, MD JXB:JYNWGNFA A Kauth is a 37 y.o. male presenting to clinic today for:  1. Shoulder pain Patient reports onset of right shoulder pain a while ago.  He notes that it recurred about 3 weeks ago.  Pain seems worse with flexion.  He notes that pain is throbbing in sensation.  He has preserved range of motion per his report.  Denies preceding injury.  Pain is located along the anterior lateral aspect of the shoulder.  Denies radiation down the arm, back or neck.  Denies numbness, tingling, weakness, swelling, redness.  Patient has tried Voltaren, with little relief.  Denies h/o previous injury or surgery.  2. Knee pain Patient reports that about 1 week ago he stepped off a truck and started having pain in the left anterior medial aspect of his knee.  He reports that pain is gradually been worsening.  He has been using Voltaren without any improvement in symptoms.  He also applied CBD pain cream with some improvement.  He is unsure if he hyperextended or just twisted his knee.  Denies gross swelling, ecchymosis or erythema after injury or since injury.  He denies locking, popping, falls, numbness or tingling.  He does feel somewhat unstable with standing but has been ambulating independently with only a small limp.   ROS: Per HPI  No Known Allergies Past Medical History:  Diagnosis Date  . GERD (gastroesophageal reflux disease)   . Insomnia     Current Outpatient Medications:  .  cyclobenzaprine (FLEXERIL) 10 MG tablet, Take 1 tablet (10 mg total) by mouth 3 (three) times daily as needed for muscle spasms., Disp: 30 tablet, Rfl: 5 .  diclofenac (VOLTAREN) 75 MG EC tablet, Take 1 tablet (75 mg total) by mouth 2 (two) times daily. For muscle and  Joint pain, Disp: 60 tablet, Rfl: 5 .  Eluxadoline (VIBERZI) 100 MG TABS, Take 1 tablet (100 mg total) by mouth 2 (two) times daily., Disp: 60 tablet, Rfl: 2 .  pantoprazole  (PROTONIX) 40 MG tablet, Take 1 tablet (40 mg total) by mouth daily., Disp: 30 tablet, Rfl: 11 .  zolpidem (AMBIEN) 10 MG tablet, Take 1 tablet (10 mg total) by mouth at bedtime., Disp: 30 tablet, Rfl: 2 Social History   Socioeconomic History  . Marital status: Married    Spouse name: Not on file  . Number of children: Not on file  . Years of education: Not on file  . Highest education level: Not on file  Social Needs  . Financial resource strain: Not on file  . Food insecurity - worry: Not on file  . Food insecurity - inability: Not on file  . Transportation needs - medical: Not on file  . Transportation needs - non-medical: Not on file  Occupational History  . Not on file  Tobacco Use  . Smoking status: Never Smoker  . Smokeless tobacco: Never Used  Substance and Sexual Activity  . Alcohol use: Yes  . Drug use: No  . Sexual activity: Not on file  Other Topics Concern  . Not on file  Social History Narrative  . Not on file   Family History  Problem Relation Age of Onset  . Hypertension Mother   . Heart disease Mother     Objective: Office vital signs reviewed. BP 137/90   Pulse 82   Temp (!) 97 F (36.1 C) (Oral)   Ht 6' (1.829 m)   Wt 289 lb (  131.1 kg)   BMI 39.20 kg/m   Physical Examination:  General: Awake, alert, well nourished, No acute distress Extremities: warm, well perfused, No edema, cyanosis or clubbing; +2 pulses bilaterally MSK: normal gait and normal station  Right shoulder: Patient has full active range of motion.  No visible or palpable bony abnormalities.  No gross effusion, swelling, redness or ecchymosis.  No tenderness to the shoulder.  No tenderness to the clavicle.  Negative empty can.  Negative crossover.  Mildly positive Hawkins.  Left knee: Patient has full active range of motion.  No visible effusions, swelling, discoloration.  No palpable bony abnormalities.  No tenderness to palpation to the patella, patellar tendon, quad tendon,  posterior popliteal fossa.  He does have mild joint line tenderness along the medial aspect of the knee.  No ligamentous laxity.  Positive Thessaly's noted. Skin: dry; intact; no rashes or lesions Neuro: 5/5 UE and LE Strength and light touch sensation grossly intact  JOINT INJECTION:  Patient denies allergy to antiseptics (including iodine) and anesthetics.  Patient denies h/o diabetes, frequent steroid use, use of blood thinners/ antiplatelets.  Patient was given informed consent and a signed copy has been placed in the chart. Appropriate time out was taken. Area prepped and draped in usual sterile fashion. Anatomic landmarks were identified and injection site was marked.  Ethyl chloride spray was used to numb the area and 1 cc of methylprednisolone 40 mg/ml plus  3 cc of 1% lidocaine without epinephrine was injected into the right shoulder using a(n) posterior approach. The patient tolerated the procedure well and there were no immediate complications. Estimated blood loss is less than 1 cc.  Post procedure instructions were reviewed and handout outlining these instructions were provided to patient.  Assessment/ Plan: 37 y.o. male   1. Chronic right shoulder pain Possible labral versus intra-articular issue of the right shoulder.  Nothing on his exam to suggest a rotator cuff tear.  He had preserved strength and was neurologically intact.  Shoulder injection performed as above.  Patient tolerated procedure well.  Home care instructions reviewed.  Patient will follow-up as needed.  2. Acute pain of left knee This may be a small meniscal tear, given his tenderness to the medial aspect of the knee and positive Thessaly's.  I doubt a substantial/large tear given lack of swelling and discoloration.  Mechanics are totally preserved.  I did recommend that he continue with oral NSAIDs.  I have discontinued the Voltaren and started him on meloxicam instead.  He will schedule follow-up appointment for 1  week recheck.  Home care instructions reviewed.  Avoid aggravating activities.  Handout provided.  Meds ordered this encounter  Medications  . meloxicam (MOBIC) 15 MG tablet    Sig: Take 1 tablet (15 mg total) by mouth daily.    Dispense:  30 tablet    Refill:  0     Ashly Hulen SkainsM Gottschalk, DO [facility] [phone]

## 2017-11-01 NOTE — Patient Instructions (Signed)
Your given a steroid shot to your right shoulder today.  Below your home care instructions.  If you develop any other worrisome symptoms or signs that we discussed, please seek immediate medical attention  For your knee, I do think that you may have a small meniscal tear.  I have prescribed you meloxicam to take in place of the Voltaren.  Please follow-up with your primary care doctor within the next 7-10 days for recheck.  If you have persistent symptoms you may need imaging or referral to the orthopedist.  Knee Injection, Care After Refer to this sheet in the next few weeks. These instructions provide you with information about caring for yourself after your procedure. Your health care provider may also give you more specific instructions. Your treatment has been planned according to current medical practices, but problems sometimes occur. Call your health care provider if you have any problems or questions after your procedure. What can I expect after the procedure? After the procedure, it is common to have:  Soreness.  Warmth.  Swelling.  You may have more pain, swelling, and warmth than you did before the injection. This reaction may last for about one day. Follow these instructions at home: Bathing  If you were given a bandage (dressing), keep it dry until your health care provider says it can be removed. Ask your health care provider when you can start showering or taking a bath. Managing pain, stiffness, and swelling  If directed, apply ice to the injection area: ? Put ice in a plastic bag. ? Place a towel between your skin and the bag. ? Leave the ice on for 20 minutes, 2-3 times per day.  Do not apply heat to your knee.  Raise the injection area above the level of your heart while you are sitting or lying down. Activity  Avoid strenuous activities for as long as directed by your health care provider. Ask your health care provider when you can return to your normal  activities. General instructions  Take medicines only as directed by your health care provider.  Do not take aspirin or other over-the-counter medicines unless your health care provider says you can.  Check your injection site every day for signs of infection. Watch for: ? Redness, swelling, or pain. ? Fluid, blood, or pus.  Follow your health care provider's instructions about dressing changes and removal. Contact a health care provider if:  You have symptoms at your injection site that last longer than two days after your procedure.  You have redness, swelling, or pain in your injection area.  You have fluid, blood, or pus coming from your injection site.  You have warmth in your injection area.  You have a fever.  Your pain is not controlled with medicine. Get help right away if:  Your knee turns very red.  Your knee becomes very swollen.  Your knee pain is severe. This information is not intended to replace advice given to you by your health care provider. Make sure you discuss any questions you have with your health care provider. Document Released: 09/10/2014 Document Revised: 04/25/2016 Document Reviewed: 06/30/2014 Elsevier Interactive Patient Education  2018 [employer]   Meniscus Tear A meniscus tear is a knee injury in which a piece of the meniscus is torn. The meniscus is a thick, rubbery, wedge-shaped cartilage in the knee. Two menisci are located in each knee. They sit between the upper bone (femur) and lower bone (tibia) that make up the knee joint. Each meniscus  acts as a shock absorber for the knee. A torn meniscus is one of the most common types of knee injuries. This injury can range from mild to severe. Surgery may be needed for a severe tear. What are the causes? This injury may be caused by any squatting, twisting, or pivoting movement. Sports-related injuries are the most common cause. These often occur from:  Running and stopping  suddenly.  Changing direction.  Being tackled or knocked off your feet.  As people get older, their meniscus gets thinner and weaker. In these people, tears can happen more easily, such as from climbing stairs. What increases the risk? This injury is more likely to happen to:  People who play contact sports.  Males.  People who are 68?38 years of age.  What are the signs or symptoms? Symptoms of this injury include:  Knee pain, especially at the side of the knee joint. You may feel pain when the injury occurs, or you may only hear a pop and feel pain later.  A feeling that your knee is clicking, catching, locking, or giving way.  Not being able to fully bend or extend your knee.  Bruising or swelling in your knee.  How is this diagnosed? This injury may be diagnosed based on your symptoms and a physical exam. The physical exam may include:  Moving your knee in different ways.  Feeling for tenderness.  Listening for a clicking sound.  Checking if your knee locks or catches.  You may also have tests, such as:  X-rays.  MRI.  A procedure to look inside your knee with a narrow surgical telescope (arthroscopy).  You may be referred to a knee specialist (orthopedic surgeon). How is this treated? Treatment for this injury depends on the severity of the tear. Treatment for a mild tear may include:  Rest.  Medicine to reduce pain and swelling. This is usually a nonsteroidal anti-inflammatory drug (NSAID).  A knee brace or an elastic sleeve or wrap.  Using crutches or a walker to keep weight off your knee and to help you walk.  Exercises to strengthen your knee (physical therapy).  You may need surgery if you have a severe tear or if other treatments are not working. Follow these instructions at home: Managing pain and swelling  Take over-the-counter and prescription medicines only as told by your health care provider.  If directed, apply ice to the injured  area: ? Put ice in a plastic bag. ? Place a towel between your skin and the bag. ? Leave the ice on for 20 minutes, 2-3 times per day.  Raise (elevate) the injured area above the level of your heart while you are sitting or lying down. Activity  Do not use the injured limb to support your body weight until your health care provider says that you can. Use crutches or a walker as told by your health care provider.  Return to your normal activities as told by your health care provider. Ask your health care provider what activities are safe for you.  Perform range-of-motion exercises only as told by your health care provider.  Begin doing exercises to strengthen your knee and leg muscles only as told by your health care provider. After you recover, your health care provider may recommend these exercises to help prevent another injury. General instructions  Use a knee brace or elastic wrap as told by your health care provider.  Keep all follow-up visits as told by your health care provider. This is  important. Contact a health care provider if:  You have a fever.  Your knee becomes red, tender, or swollen.  Your pain medicine is not helping.  Your symptoms get worse or do not improve after 2 weeks of home care. This information is not intended to replace advice given to you by your health care provider. Make sure you discuss any questions you have with your health care provider. Document Released: 11/10/2002 Document Revised: 01/26/2016 Document Reviewed: 12/13/2014 Elsevier Interactive Patient Education  [employer].

## 2017-11-06 ENCOUNTER — Other Ambulatory Visit: Payer: Self-pay | Admitting: Family Medicine

## 2017-11-06 NOTE — Telephone Encounter (Signed)
Last seen 11/01/17  Dr Reece AgarG   Dr Dettinger PCP

## 2017-11-20 ENCOUNTER — Ambulatory Visit: Payer: BLUE CROSS/BLUE SHIELD | Admitting: Family Medicine

## 2017-11-20 NOTE — Progress Notes (Deleted)
   Subjective: CC:*** PCP: Dettinger, Elige RadonJoshua A, MD XLK:GMWNUUVOHPI:Alexander Fowler is a 37 y.o. male presenting to clinic today for:  1. ***   ROS: Per HPI  No Known Allergies Past Medical History:  Diagnosis Date  . GERD (gastroesophageal reflux disease)   . Insomnia     Current Outpatient Medications:  .  cyclobenzaprine (FLEXERIL) 10 MG tablet, Take 1 tablet (10 mg total) by mouth 3 (three) times daily as needed for muscle spasms., Disp: 30 tablet, Rfl: 5 .  Eluxadoline (VIBERZI) 100 MG TABS, Take 1 tablet (100 mg total) by mouth 2 (two) times daily., Disp: 60 tablet, Rfl: 2 .  meloxicam (MOBIC) 15 MG tablet, Take 1 tablet (15 mg total) by mouth daily., Disp: 30 tablet, Rfl: 0 .  pantoprazole (PROTONIX) 40 MG tablet, Take 1 tablet (40 mg total) by mouth daily., Disp: 30 tablet, Rfl: 11 .  zolpidem (AMBIEN) 10 MG tablet, TAKE ONE TABLET BY MOUTH AT BEDTIME, Disp: 30 tablet, Rfl: 2 Social History   Socioeconomic History  . Marital status: Married    Spouse name: Not on file  . Number of children: Not on file  . Years of education: Not on file  . Highest education level: Not on file  Social Needs  . Financial resource strain: Not on file  . Food insecurity - worry: Not on file  . Food insecurity - inability: Not on file  . Transportation needs - medical: Not on file  . Transportation needs - non-medical: Not on file  Occupational History  . Not on file  Tobacco Use  . Smoking status: Never Smoker  . Smokeless tobacco: Never Used  Substance and Sexual Activity  . Alcohol use: Yes  . Drug use: No  . Sexual activity: Not on file  Other Topics Concern  . Not on file  Social History Narrative  . Not on file   Family History  Problem Relation Age of Onset  . Hypertension Mother   . Heart disease Mother     Objective: Office vital signs reviewed. There were no vitals taken for this visit.  Physical Examination:  General: Awake, alert, *** nourished, No acute  distress HEENT: Normal    Neck: No masses palpated. No lymphadenopathy    Ears: Tympanic membranes intact, normal light reflex, no erythema, no bulging    Eyes: PERRLA, extraocular membranes intact, sclera ***    Nose: nasal turbinates moist, *** nasal discharge    Throat: moist mucus membranes, no erythema, *** tonsillar exudate.  Airway is patent Cardio: regular rate and rhythm, S1S2 heard, no murmurs appreciated Pulm: clear to auscultation bilaterally, no wheezes, rhonchi or rales; normal work of breathing on room air GI: soft, non-tender, non-distended, bowel sounds present x4, no hepatomegaly, no splenomegaly, no masses GU: external vaginal tissue ***, cervix ***, *** punctate lesions on cervix appreciated, *** discharge from cervical os, *** bleeding, *** cervical motion tenderness, *** abdominal/ adnexal masses Extremities: warm, well perfused, No edema, cyanosis or clubbing; +*** pulses bilaterally MSK: *** gait and *** station Skin: dry; intact; no rashes or lesions Neuro: *** Strength and light touch sensation grossly intact, *** DTRs ***/4  Assessment/ Plan: 37 y.o. male   ***  No orders of the defined types were placed in this encounter.  No orders of the defined types were placed in this encounter.    Raliegh IpAshly M Estephania Licciardi, DO [facility] [phone]

## 2017-11-21 ENCOUNTER — Other Ambulatory Visit: Payer: Self-pay | Admitting: Family Medicine

## 2017-11-21 DIAGNOSIS — K2100 Gastro-esophageal reflux disease with esophagitis, without bleeding: Secondary | ICD-10-CM

## 2017-11-21 MED ORDER — PANTOPRAZOLE SODIUM 40 MG PO TBEC: 40.0000 mg | DELAYED_RELEASE_TABLET | Freq: Every day | ORAL | 5 refills | Status: DC

## 2017-11-21 NOTE — Telephone Encounter (Signed)
Pt aware - refill sent to [facility]  

## 2017-12-09 ENCOUNTER — Other Ambulatory Visit: Payer: Self-pay | Admitting: Family Medicine

## 2017-12-09 DIAGNOSIS — K580 Irritable bowel syndrome with diarrhea: Secondary | ICD-10-CM

## 2017-12-09 MED ORDER — ELUXADOLINE 100 MG PO TABS: 1.0000 | ORAL_TABLET | Freq: Two times a day (BID) | ORAL | 1 refills | Status: DC

## 2017-12-09 NOTE — Progress Notes (Unsigned)
I sent Viberzi for the patient

## 2018-02-06 ENCOUNTER — Other Ambulatory Visit: Payer: Self-pay | Admitting: Family Medicine

## 2018-03-07 ENCOUNTER — Encounter: Payer: Self-pay | Admitting: Family Medicine

## 2018-03-07 ENCOUNTER — Ambulatory Visit: Payer: BLUE CROSS/BLUE SHIELD | Admitting: Family Medicine

## 2018-03-07 VITALS — BP 125/88 | HR 105 | Temp 97.5°F | Ht 72.0 in | Wt 273.6 lb

## 2018-03-07 DIAGNOSIS — M5442 Lumbago with sciatica, left side: Secondary | ICD-10-CM

## 2018-03-07 MED ORDER — PREDNISONE 20 MG PO TABS: ORAL_TABLET | ORAL | 0 refills | Status: DC

## 2018-03-07 MED ORDER — METHYLPREDNISOLONE ACETATE 80 MG/ML IJ SUSP
80.0000 mg | Freq: Once | INTRAMUSCULAR | Status: AC
  Administered 2018-03-07: 80 mg via INTRAMUSCULAR

## 2018-03-07 NOTE — Progress Notes (Signed)
BP 125/88   Pulse (!) 105   Temp (!) 97.5 F (36.4 C) (Oral)   Ht 6' (1.829 m)   Wt 273 lb 9.6 oz (124.1 kg)   BMI 37.11 kg/m    Subjective:    Patient ID: Alexander Fowler, male    DOB: Oct 17, 1980, 37 y.o.   MRN: [id number]  HPI: Alexander Fowler is a 37 y.o. male presenting on 03/07/2018 for Back Pain (Patient states he hurt it yesetrday while moving something.)   HPI Low back pain Patient comes in complaining of low back pain that is bilateral in a bandlike area and he does complain of some shooting pain going all the way down his left leg almost to the foot or ankle.  He says this all flared up yesterday when he was trying to move a boat off of a sand bar and he felt something tweak and that has been worsening since yesterday.  He has been taking ibuprofen 800 mg and been using his TENS unit and a back brace and his Flexeril.  He does not feel like they are helping as of yet.  He denies any numbness or weakness in his legs.  He has been concerned because this is been happening recurrently and would like to go see a specialist about this because we have not been able to get an MRI.  He says it will get better but then it comes back about every 2 to 6 months whenever he has some movement that makes it angry.  Relevant past medical, surgical, family and social history reviewed and updated as indicated. Interim medical history since our last visit reviewed. Allergies and medications reviewed and updated.  Review of Systems  Constitutional: Negative for chills and fever.  Respiratory: Negative for shortness of breath and wheezing.   Cardiovascular: Negative for chest pain and leg swelling.  Musculoskeletal: Positive for arthralgias and back pain. Negative for gait problem.  Skin: Negative for rash.  All other systems reviewed and are negative.   Per HPI unless specifically indicated above   Allergies as of 03/07/2018   No Known Allergies     Medication List        Accurate  as of 03/07/18  8:23 AM. Always use your most recent med list.          cyclobenzaprine 10 MG tablet Commonly known as:  FLEXERIL Take 1 tablet (10 mg total) by mouth 3 (three) times daily as needed for muscle spasms.   pantoprazole 40 MG tablet Commonly known as:  PROTONIX Take 1 tablet (40 mg total) by mouth daily.   predniSONE 20 MG tablet Commonly known as:  DELTASONE 2 po at same time daily for 5 days   VIBERZI 100 MG Tabs Generic drug:  Eluxadoline Take by mouth.   zolpidem 10 MG tablet Commonly known as:  AMBIEN TAKE 1 TABLET BY MOUTH AT BEDTIME          Objective:    BP 125/88   Pulse (!) 105   Temp (!) 97.5 F (36.4 C) (Oral)   Ht 6' (1.829 m)   Wt 273 lb 9.6 oz (124.1 kg)   BMI 37.11 kg/m   Wt Readings from Last 3 Encounters:  03/07/18 273 lb 9.6 oz (124.1 kg)  11/01/17 289 lb (131.1 kg)  01/09/17 267 lb (121.1 kg)    Physical Exam  Constitutional: He is oriented to person, place, and time. He appears well-developed and well-nourished. No distress.  Eyes:  Conjunctivae are normal. No scleral icterus.  Cardiovascular: Normal rate, regular rhythm, normal heart sounds and intact distal pulses.  No murmur heard. Pulmonary/Chest: Effort normal and breath sounds normal. No respiratory distress. He has no wheezes.  Musculoskeletal: Normal range of motion. He exhibits tenderness (Low back pain, bilateral, negative straight leg raise bilaterally). He exhibits no edema.       Lumbar back: He exhibits tenderness. He exhibits normal range of motion, no bony tenderness, no swelling and no deformity.  Neurological: He is alert and oriented to person, place, and time. Coordination normal.  Skin: Skin is warm and dry. No rash noted. He is not diaphoretic.  Psychiatric: He has a normal mood and affect. His behavior is normal.  Nursing note and vitals reviewed.       Assessment & Plan:   Problem List Items Addressed This Visit    None    Visit Diagnoses    Acute  bilateral low back pain with left-sided sciatica    -  Primary   Relevant Medications   methylPREDNISolone acetate (DEPO-MEDROL) injection 80 mg (Start on 03/07/2018  8:30 AM)   predniSONE (DELTASONE) 20 MG tablet      Give a list of stretches and exercises and patient will continue using TENS unit and a short course of prednisone and then continue to use ibuprofen and let me know if it does not improve.  We will also do an orthopedic referral because he is sick of having this recurrently Follow up plan: Return if symptoms worsen or fail to improve.  Counseling provided for all of the vaccine components No orders of the defined types were placed in this encounter.   Arville Care, MD [facility] 03/07/2018, 8:23 AM

## 2018-03-17 ENCOUNTER — Other Ambulatory Visit: Payer: Self-pay | Admitting: Family Medicine

## 2018-03-17 ENCOUNTER — Telehealth: Payer: Self-pay

## 2018-03-17 DIAGNOSIS — M5442 Lumbago with sciatica, left side: Secondary | ICD-10-CM

## 2018-03-17 NOTE — Progress Notes (Unsigned)
I do not know how the order did not get placed but I have put it in today, let see if we can speed it up. Arville CareJoshua Jayvian Escoe, MD [facility] 03/17/2018, 3:36 PM

## 2018-03-17 NOTE — Telephone Encounter (Signed)
Patient calling about his spine Dr Referral   (no referral placed for patient)

## 2018-03-21 ENCOUNTER — Telehealth: Payer: Self-pay | Admitting: Family Medicine

## 2018-03-22 DIAGNOSIS — M545 Low back pain: Secondary | ICD-10-CM | POA: Diagnosis not present

## 2018-04-21 ENCOUNTER — Other Ambulatory Visit: Payer: Self-pay | Admitting: Family Medicine

## 2018-04-21 DIAGNOSIS — K580 Irritable bowel syndrome with diarrhea: Secondary | ICD-10-CM

## 2018-05-22 ENCOUNTER — Other Ambulatory Visit: Payer: Self-pay | Admitting: Family Medicine

## 2018-05-22 DIAGNOSIS — K2100 Gastro-esophageal reflux disease with esophagitis, without bleeding: Secondary | ICD-10-CM

## 2018-06-10 ENCOUNTER — Other Ambulatory Visit: Payer: Self-pay | Admitting: *Deleted

## 2018-06-10 DIAGNOSIS — K2100 Gastro-esophageal reflux disease with esophagitis, without bleeding: Secondary | ICD-10-CM

## 2018-06-13 ENCOUNTER — Other Ambulatory Visit: Payer: Self-pay | Admitting: Family Medicine

## 2018-06-13 DIAGNOSIS — K2100 Gastro-esophageal reflux disease with esophagitis, without bleeding: Secondary | ICD-10-CM

## 2018-06-13 MED ORDER — PANTOPRAZOLE SODIUM 40 MG PO TBEC: 40.0000 mg | DELAYED_RELEASE_TABLET | Freq: Every day | ORAL | 0 refills | Status: DC

## 2018-06-13 NOTE — Telephone Encounter (Signed)
What is the name of the medication? pantoprazole (PROTONIX) 40 MG tablet  Have you contacted your pharmacy to request a refill? yes  Which pharmacy would you like this sent to? [facility]   Patient notified that their request is being sent to the clinical staff for review and that they should receive a call once it is complete. If they do not receive a call within 24 hours they can check with their pharmacy or our office.

## 2018-06-13 NOTE — Telephone Encounter (Signed)
appt made for 06/18/18. RF pantoprazole 30 days sent to pharmacy

## 2018-06-18 ENCOUNTER — Ambulatory Visit: Payer: BLUE CROSS/BLUE SHIELD | Admitting: Family Medicine

## 2018-06-18 ENCOUNTER — Encounter: Payer: Self-pay | Admitting: Family Medicine

## 2018-06-18 VITALS — BP 132/87 | HR 82 | Temp 98.5°F | Ht 72.0 in | Wt 284.8 lb

## 2018-06-18 DIAGNOSIS — K2100 Gastro-esophageal reflux disease with esophagitis, without bleeding: Secondary | ICD-10-CM

## 2018-06-18 DIAGNOSIS — K580 Irritable bowel syndrome with diarrhea: Secondary | ICD-10-CM | POA: Diagnosis not present

## 2018-06-18 DIAGNOSIS — S39012A Strain of muscle, fascia and tendon of lower back, initial encounter: Secondary | ICD-10-CM | POA: Diagnosis not present

## 2018-06-18 DIAGNOSIS — Z13220 Encounter for screening for lipoid disorders: Secondary | ICD-10-CM | POA: Diagnosis not present

## 2018-06-18 MED ORDER — ZOLPIDEM TARTRATE 10 MG PO TABS: 10.0000 mg | ORAL_TABLET | Freq: Every day | ORAL | 5 refills | Status: DC

## 2018-06-18 MED ORDER — CYCLOBENZAPRINE HCL 10 MG PO TABS: 10.0000 mg | ORAL_TABLET | Freq: Three times a day (TID) | ORAL | 5 refills | Status: DC | PRN

## 2018-06-18 MED ORDER — ELUXADOLINE 100 MG PO TABS: 1.0000 | ORAL_TABLET | Freq: Every day | ORAL | 5 refills | Status: DC

## 2018-06-18 MED ORDER — PANTOPRAZOLE SODIUM 40 MG PO TBEC: 40.0000 mg | DELAYED_RELEASE_TABLET | Freq: Every day | ORAL | 3 refills | Status: DC

## 2018-06-18 NOTE — Progress Notes (Signed)
BP 132/87   Pulse 82   Temp 98.5 F (36.9 C) (Oral)   Ht 6' (1.829 m)   Wt 284 lb 12.8 oz (129.2 kg)   BMI 38.63 kg/m    Subjective:    Patient ID: Alexander Fowler, male    DOB: February 08, 1981, 37 y.o.   MRN: [id number]  HPI: Alexander Fowler is a 37 y.o. male presenting on 06/18/2018 for Gastroesophageal Reflux (check up of chronic medical conditions) and Insomnia   HPI GERD Patient is currently on Protonix and Viberzi.  She denies any major symptoms or abdominal pain or belching or burping. She denies any blood in her stool or lightheadedness or dizziness.   Low back pain Patient is coming in with low back pain that is especially worse in his right lower back.  He denies any radiation of that look back pain anywhere else.  He says is more of muscle tightness and achiness that hurts more when he is sitting for prolonged periods of time.  IBS Patient is coming in for IBS recheck.  He has been taking the Viberzi and feels like his been doing very well from.  He has been having his bowel movements daily and sometimes twice daily and is been greatly improved from where it has been previously.  He denies any abdominal pain or blood in stool.  Relevant past medical, surgical, family and social history reviewed and updated as indicated. Interim medical history since our last visit reviewed. Allergies and medications reviewed and updated.  Review of Systems  Constitutional: Negative for chills and fever.  Eyes: Negative for visual disturbance.  Respiratory: Negative for shortness of breath and wheezing.   Cardiovascular: Negative for chest pain and leg swelling.  Gastrointestinal: Negative for abdominal distention, abdominal pain, constipation, diarrhea, nausea and vomiting.  Musculoskeletal: Positive for back pain. Negative for gait problem.  Skin: Negative for rash.  Neurological: Negative for dizziness, weakness and light-headedness.  All other systems reviewed and are  negative.   Per HPI unless specifically indicated above   Allergies as of 06/18/2018   No Known Allergies     Medication List        Accurate as of 06/18/18 11:55 AM. Always use your most recent med list.          cyclobenzaprine 10 MG tablet Commonly known as:  FLEXERIL Take 1 tablet (10 mg total) by mouth 3 (three) times daily as needed for muscle spasms.   pantoprazole 40 MG tablet Commonly known as:  PROTONIX Take 1 tablet (40 mg total) by mouth daily.   VIBERZI 100 MG Tabs Generic drug:  Eluxadoline Take by mouth.   zolpidem 10 MG tablet Commonly known as:  AMBIEN TAKE 1 TABLET BY MOUTH AT BEDTIME          Objective:    BP 132/87   Pulse 82   Temp 98.5 F (36.9 C) (Oral)   Ht 6' (1.829 m)   Wt 284 lb 12.8 oz (129.2 kg)   BMI 38.63 kg/m   Wt Readings from Last 3 Encounters:  06/18/18 284 lb 12.8 oz (129.2 kg)  03/07/18 273 lb 9.6 oz (124.1 kg)  11/01/17 289 lb (131.1 kg)    Physical Exam  Constitutional: He is oriented to person, place, and time. He appears well-developed and well-nourished. No distress.  Eyes: Conjunctivae are normal. No scleral icterus.  Neck: Neck supple. No thyromegaly present.  Cardiovascular: Normal rate, regular rhythm, normal heart sounds and intact distal pulses.  No murmur heard. Pulmonary/Chest: Effort normal and breath sounds normal. No respiratory distress. He has no wheezes.  Abdominal: Soft. Bowel sounds are normal. He exhibits no distension and no mass. There is no tenderness. There is no guarding.  Musculoskeletal: Normal range of motion. He exhibits no edema.  Lymphadenopathy:    He has no cervical adenopathy.  Neurological: He is alert and oriented to person, place, and time. Coordination normal.  Skin: Skin is warm and dry. No rash noted. He is not diaphoretic.  Psychiatric: He has a normal mood and affect. His behavior is normal.  Nursing note and vitals reviewed.       Assessment & Plan:   Problem  List Items Addressed This Visit      Digestive   GERD (gastroesophageal reflux disease)   Relevant Medications   pantoprazole (PROTONIX) 40 MG tablet   Eluxadoline (VIBERZI) 100 MG TABS   Other Relevant Orders   CBC with Differential/Platelet (Completed)   CMP14+EGFR (Completed)   Irritable bowel syndrome - Primary   Relevant Medications   pantoprazole (PROTONIX) 40 MG tablet   Eluxadoline (VIBERZI) 100 MG TABS    Other Visit Diagnoses    Strain of lumbar region, initial encounter       Relevant Medications   cyclobenzaprine (FLEXERIL) 10 MG tablet   Lipid screening       Relevant Orders   Lipid panel (Completed)       Follow up plan: Return in about 6 months (around 12/18/2018), or if symptoms worsen or fail to improve.  Counseling provided for all of the vaccine components No orders of the defined types were placed in this encounter.   Caryl Pina, MD [facility] 06/18/2018, 11:55 AM

## 2018-06-19 LAB — CBC WITH DIFFERENTIAL/PLATELET
Basophils Absolute: 0.1 x10E3/uL (ref 0.0–0.2)
Basos: 2 %
EOS (ABSOLUTE): 0.2 x10E3/uL (ref 0.0–0.4)
Eos: 3 %
Hematocrit: 46.4 % (ref 37.5–51.0)
Hemoglobin: 16.6 g/dL (ref 13.0–17.7)
Immature Grans (Abs): 0 x10E3/uL (ref 0.0–0.1)
Immature Granulocytes: 0 %
Lymphocytes Absolute: 2.2 x10E3/uL (ref 0.7–3.1)
Lymphs: 35 %
MCH: 32.1 pg (ref 26.6–33.0)
MCHC: 35.8 g/dL — ABNORMAL HIGH (ref 31.5–35.7)
MCV: 90 fL (ref 79–97)
Monocytes Absolute: 0.6 x10E3/uL (ref 0.1–0.9)
Monocytes: 10 %
Neutrophils Absolute: 3 x10E3/uL (ref 1.4–7.0)
Neutrophils: 50 %
Platelets: 180 x10E3/uL (ref 150–450)
RBC: 5.17 x10E6/uL (ref 4.14–5.80)
RDW: 13.5 % (ref 12.3–15.4)
WBC: 6.2 x10E3/uL (ref 3.4–10.8)

## 2018-06-19 LAB — CMP14+EGFR
ALT: 39 IU/L (ref 0–44)
AST: 31 IU/L (ref 0–40)
Albumin/Globulin Ratio: 1.7 (ref 1.2–2.2)
Albumin: 4.8 g/dL (ref 3.5–5.5)
Alkaline Phosphatase: 85 IU/L (ref 39–117)
BUN/Creatinine Ratio: 13 (ref 9–20)
BUN: 14 mg/dL (ref 6–20)
Bilirubin Total: 1 mg/dL (ref 0.0–1.2)
CO2: 26 mmol/L (ref 20–29)
Calcium: 10.1 mg/dL (ref 8.7–10.2)
Chloride: 97 mmol/L (ref 96–106)
Creatinine, Ser: 1.08 mg/dL (ref 0.76–1.27)
GFR calc Af Amer: 101 mL/min/1.73
GFR calc non Af Amer: 87 mL/min/1.73
Globulin, Total: 2.8 g/dL (ref 1.5–4.5)
Glucose: 92 mg/dL (ref 65–99)
Potassium: 4.3 mmol/L (ref 3.5–5.2)
Sodium: 138 mmol/L (ref 134–144)
Total Protein: 7.6 g/dL (ref 6.0–8.5)

## 2018-06-19 LAB — LIPID PANEL
CHOLESTEROL TOTAL: 209 mg/dL — AB (ref 100–199)
Chol/HDL Ratio: 6.1 ratio — ABNORMAL HIGH (ref 0.0–5.0)
HDL: 34 mg/dL — AB (ref >39)
LDL Calculated: 99 mg/dL (ref 0–99)
TRIGLYCERIDES: 379 mg/dL — AB (ref 0–149)
VLDL CHOLESTEROL CAL: 76 mg/dL — AB (ref 5–40)

## 2018-07-16 ENCOUNTER — Ambulatory Visit (INDEPENDENT_AMBULATORY_CARE_PROVIDER_SITE_OTHER): Payer: BLUE CROSS/BLUE SHIELD

## 2018-07-16 ENCOUNTER — Ambulatory Visit: Payer: BLUE CROSS/BLUE SHIELD | Admitting: Family Medicine

## 2018-07-16 ENCOUNTER — Encounter: Payer: Self-pay | Admitting: Family Medicine

## 2018-07-16 VITALS — BP 129/89 | HR 81 | Temp 97.6°F | Ht 72.0 in | Wt 281.0 lb

## 2018-07-16 DIAGNOSIS — R109 Unspecified abdominal pain: Secondary | ICD-10-CM

## 2018-07-16 DIAGNOSIS — R1030 Lower abdominal pain, unspecified: Secondary | ICD-10-CM | POA: Diagnosis not present

## 2018-07-16 DIAGNOSIS — R197 Diarrhea, unspecified: Secondary | ICD-10-CM

## 2018-07-16 NOTE — Progress Notes (Signed)
Subjective: CC: diarrhea PCP: Dettinger, Elige Radon, MD WUJ:[id number] A [name] is a 37 y.o. male presenting to clinic today for:  1. Diarrhea Patient reports onset of watery diarrhea Saturday evening.  He notes that he had more than 20 episodes in a single day.  Yesterday, it has gradually gotten better to 7 episodes of nonbloody diarrhea.  He describes intense abdominal cramping associated with the diarrhea.  This morning he has not had any cramping surprisingly.  He reports a fever to 100.3 F Saturday evening.  He notes associated chills.  No nausea or vomiting but he did induce vomiting because he felt bloated wants.  He has not seen any hematochezia, melena or mucus in the stool.  No known ingestion of undercooked foods, foods left out too long.  No other family members sick.  He does admit to drinking untreated stream water but none recently.  He is hydrating without difficulty but is avoiding food, as this seems to exacerbate symptoms.  His past medical history is significant for IBS and he is currently treated with Viberzi and Protonix.  He notes this does not feel like his typical IBS symptoms.   ROS: Per HPI  No Known Allergies Past Medical History:  Diagnosis Date  . GERD (gastroesophageal reflux disease)   . Insomnia     Current Outpatient Medications:  .  cyclobenzaprine (FLEXERIL) 10 MG tablet, Take 1 tablet (10 mg total) by mouth 3 (three) times daily as needed for muscle spasms., Disp: 30 tablet, Rfl: 5 .  Eluxadoline (VIBERZI) 100 MG TABS, Take 1 tablet (100 mg total) by mouth daily at 2 PM., Disp: 60 tablet, Rfl: 5 .  pantoprazole (PROTONIX) 40 MG tablet, Take 1 tablet (40 mg total) by mouth daily., Disp: 90 tablet, Rfl: 3 .  zolpidem (AMBIEN) 10 MG tablet, Take 1 tablet (10 mg total) by mouth at bedtime., Disp: 30 tablet, Rfl: 5 Social History   Socioeconomic History  . Marital status: Married    Spouse name: Not on file  . Number of children: Not on file  . Years of  education: Not on file  . Highest education level: Not on file  Occupational History  . Not on file  Social Needs  . Financial resource strain: Not on file  . Food insecurity:    Worry: Not on file    Inability: Not on file  . Transportation needs:    Medical: Not on file    Non-medical: Not on file  Tobacco Use  . Smoking status: Never Smoker  . Smokeless tobacco: Never Used  Substance and Sexual Activity  . Alcohol use: Yes  . Drug use: No  . Sexual activity: Not on file  Lifestyle  . Physical activity:    Days per week: Not on file    Minutes per session: Not on file  . Stress: Not on file  Relationships  . Social connections:    Talks on phone: Not on file    Gets together: Not on file    Attends religious service: Not on file    Active member of club or organization: Not on file    Attends meetings of clubs or organizations: Not on file    Relationship status: Not on file  . Intimate partner violence:    Fear of current or ex partner: Not on file    Emotionally abused: Not on file    Physically abused: Not on file    Forced sexual activity: Not on file  Other Topics Concern  . Not on file  Social History Narrative  . Not on file   Family History  Problem Relation Age of Onset  . Hypertension Mother   . Heart disease Mother     Objective: Office vital signs reviewed. BP 129/89   Pulse 81   Temp 97.6 F (36.4 C) (Oral)   Ht 6' (1.829 m)   Wt 281 lb (127.5 kg)   BMI 38.11 kg/m   Physical Examination:  General: Awake, alert, well nourished, No acute distress HEENT: sclera white, MMM GI: Obese, soft, nontender, nondistended, bowel sounds present but slightly hypoactive x4.  Alexander Fowler  Result Date: 07/16/2018 CLINICAL DATA:  Three-day history of lower abdominal pain EXAM: ABDOMEN - 1 Fowler COMPARISON:  None. FINDINGS: There is moderate stool in the colon. There is no appreciable bowel dilatation or air-fluid level to suggest bowel obstruction. No  free air. There are small apparent phleboliths in the pelvis. IMPRESSION: Moderate stool in colon.  No evident bowel obstruction or free air. Electronically Signed   By: Bretta BangWilliam  Woodruff III M.D.   On: 07/16/2018 09:03   Assessment/ Plan: 37 y.o. male   1. Abdominal cramping KUB obtained which was unremarkable except for some stool appreciated in the right colon.  No evidence of megacolon.  Patient is afebrile nontoxic-appearing.  He is well-hydrated.  His abdominal exam was unremarkable.  I question as to whether or not this may be a gastroenteritis versus diarrhea around constipation.  Given fever, will obtain GI panel to further evaluate for infectious etiology, particularly given reports of consumption of untreated water.  We will treat accordingly.  Patient to continue pushing oral fluids.  Symptoms seem to be resolving but if they do not totally resolve or if they worsen, he is to seek reevaluation. - Alexander Fowler; Future - Cdiff NAA+O+P+Stool Culture  2. Diarrhea, unspecified type - Alexander Fowler; Future - Cdiff NAA+O+P+Stool Culture   Orders Placed This Encounter  Procedures  . Cdiff NAA+O+P+Stool Culture  . Alexander Fowler    Standing Status:   Future    Number of Occurrences:   1    Standing Expiration Date:   09/15/2019    Order Specific Question:   Reason for Exam (SYMPTOM  OR DIAGNOSIS REQUIRED)    Answer:   abdominal cramping/ diarrhea x 5 days    Order Specific Question:   Preferred imaging location?    Answer:   Internal   No orders of the defined types were placed in this encounter.    Raliegh IpAshly M Gottschalk, DO [facility] [phone]

## 2018-07-16 NOTE — Patient Instructions (Signed)
Diarrhea, Adult °Diarrhea is when you have loose and water poop (stool) often. Diarrhea can make you feel weak and cause you to get dehydrated. Dehydration can make you tired and thirsty, make you have a dry mouth, and make it so you pee (urinate) less often. Diarrhea often lasts 2-3 days. However, it can last longer if it is a sign of something more serious. It is important to treat your diarrhea as told by your doctor. °Follow these instructions at home: °Eating and drinking ° °Follow these recommendations as told by your doctor: °· Take an oral rehydration solution (ORS). This is a drink that is sold at pharmacies and stores. °· Drink clear fluids, such as: °? Water. °? Ice chips. °? Diluted fruit juice. °? Low-calorie sports drinks. °· Eat bland, easy-to-digest foods in small amounts as you are able. These foods include: °? Bananas. °? Applesauce. °? Rice. °? Low-fat (lean) meats. °? Toast. °? Crackers. °· Avoid drinking fluids that have a lot of sugar or caffeine in them. °· Avoid alcohol. °· Avoid spicy or fatty foods. ° °General instructions ° °· Drink enough fluid to keep your pee (urine) clear or pale yellow. °· Wash your hands often. If you cannot use soap and water, use hand sanitizer. °· Make sure that all people in your home wash their hands well and often. °· Take over-the-counter and prescription medicines only as told by your doctor. °· Rest at home while you get better. °· Watch your condition for any changes. °· Take a warm bath to help with any burning or pain from having diarrhea. °· Keep all follow-up visits as told by your doctor. This is important. °Contact a doctor if: °· You have a fever. °· Your diarrhea gets worse. °· You have new symptoms. °· You cannot keep fluids down. °· You feel light-headed or dizzy. °· You have a headache. °· You have muscle cramps. °Get help right away if: °· You have chest pain. °· You feel very weak or you pass out (faint). °· You have bloody or black poop or  poop that look like tar. °· You have very bad pain, cramping, or bloating in your belly (abdomen). °· You have trouble breathing or you are breathing very quickly. °· Your heart is beating very quickly. °· Your skin feels cold and clammy. °· You feel confused. °· You have signs of dehydration, such as: °? Dark pee, hardly any pee, or no pee. °? Cracked lips. °? Dry mouth. °? Sunken eyes. °? Sleepiness. °? Weakness. °This information is not intended to replace advice given to you by your health care provider. Make sure you discuss any questions you have with your health care provider. °Document Released: 02/06/2008 Document Revised: 03/09/2016 Document Reviewed: 04/26/2015 °Elsevier Interactive Patient Education © 2018 [employer] ° °

## 2018-07-17 ENCOUNTER — Ambulatory Visit: Payer: BLUE CROSS/BLUE SHIELD | Admitting: Family Medicine

## 2018-07-18 LAB — CDIFF NAA+O+P+STOOL CULTURE
E COLI SHIGA TOXIN ASSAY: NEGATIVE
Toxigenic C. Difficile by PCR: NEGATIVE

## 2018-11-04 DIAGNOSIS — M47816 Spondylosis without myelopathy or radiculopathy, lumbar region: Secondary | ICD-10-CM | POA: Diagnosis not present

## 2018-11-04 DIAGNOSIS — S335XXA Sprain of ligaments of lumbar spine, initial encounter: Secondary | ICD-10-CM | POA: Diagnosis not present

## 2018-11-04 DIAGNOSIS — S134XXA Sprain of ligaments of cervical spine, initial encounter: Secondary | ICD-10-CM | POA: Diagnosis not present

## 2018-11-04 DIAGNOSIS — M546 Pain in thoracic spine: Secondary | ICD-10-CM | POA: Diagnosis not present

## 2018-12-04 DIAGNOSIS — M47816 Spondylosis without myelopathy or radiculopathy, lumbar region: Secondary | ICD-10-CM | POA: Diagnosis not present

## 2018-12-04 DIAGNOSIS — S335XXA Sprain of ligaments of lumbar spine, initial encounter: Secondary | ICD-10-CM | POA: Diagnosis not present

## 2018-12-04 DIAGNOSIS — M546 Pain in thoracic spine: Secondary | ICD-10-CM | POA: Diagnosis not present

## 2018-12-04 DIAGNOSIS — S134XXA Sprain of ligaments of cervical spine, initial encounter: Secondary | ICD-10-CM | POA: Diagnosis not present

## 2018-12-26 ENCOUNTER — Other Ambulatory Visit: Payer: Self-pay | Admitting: Family Medicine

## 2018-12-26 DIAGNOSIS — K580 Irritable bowel syndrome with diarrhea: Secondary | ICD-10-CM

## 2018-12-31 ENCOUNTER — Telehealth: Payer: Self-pay | Admitting: Family Medicine

## 2018-12-31 ENCOUNTER — Other Ambulatory Visit: Payer: Self-pay | Admitting: Physician Assistant

## 2018-12-31 ENCOUNTER — Telehealth: Payer: Self-pay | Admitting: *Deleted

## 2018-12-31 ENCOUNTER — Other Ambulatory Visit: Payer: Self-pay | Admitting: *Deleted

## 2018-12-31 ENCOUNTER — Other Ambulatory Visit: Payer: Self-pay | Admitting: Family Medicine

## 2018-12-31 DIAGNOSIS — K580 Irritable bowel syndrome with diarrhea: Secondary | ICD-10-CM

## 2018-12-31 MED ORDER — ELUXADOLINE 100 MG PO TABS: 1.0000 | ORAL_TABLET | Freq: Every day | ORAL | 0 refills | Status: DC

## 2018-12-31 NOTE — Telephone Encounter (Signed)
Sorry!  Please send in script.  This is a controled substance per research.   Tried to send twice but was printed both times.

## 2018-12-31 NOTE — Telephone Encounter (Signed)
sent 

## 2018-12-31 NOTE — Telephone Encounter (Signed)
Patient has a follow up appointment scheduled for Monday.  Can he have his medication filled, almost out?

## 2018-12-31 NOTE — Telephone Encounter (Signed)
Viberzi script needs to go to [facility], per patient.   Sorry for the inconvenience.

## 2018-12-31 NOTE — Telephone Encounter (Signed)
Okay to refill one time 

## 2019-01-05 ENCOUNTER — Encounter: Payer: Self-pay | Admitting: Family Medicine

## 2019-01-05 ENCOUNTER — Ambulatory Visit: Payer: BLUE CROSS/BLUE SHIELD | Admitting: Family Medicine

## 2019-01-05 ENCOUNTER — Other Ambulatory Visit: Payer: Self-pay

## 2019-01-05 VITALS — BP 124/82 | HR 97 | Temp 98.2°F | Ht 72.0 in | Wt 282.0 lb

## 2019-01-05 DIAGNOSIS — G8929 Other chronic pain: Secondary | ICD-10-CM

## 2019-01-05 DIAGNOSIS — Z79899 Other long term (current) drug therapy: Secondary | ICD-10-CM | POA: Diagnosis not present

## 2019-01-05 DIAGNOSIS — F5101 Primary insomnia: Secondary | ICD-10-CM | POA: Diagnosis not present

## 2019-01-05 DIAGNOSIS — K580 Irritable bowel syndrome with diarrhea: Secondary | ICD-10-CM

## 2019-01-05 DIAGNOSIS — M545 Low back pain: Secondary | ICD-10-CM

## 2019-01-05 DIAGNOSIS — K219 Gastro-esophageal reflux disease without esophagitis: Secondary | ICD-10-CM | POA: Diagnosis not present

## 2019-01-05 MED ORDER — ELUXADOLINE 100 MG PO TABS: 1.0000 | ORAL_TABLET | Freq: Every day | ORAL | 5 refills | Status: DC

## 2019-01-05 MED ORDER — CYCLOBENZAPRINE HCL 10 MG PO TABS: 10.0000 mg | ORAL_TABLET | Freq: Three times a day (TID) | ORAL | 5 refills | Status: DC | PRN

## 2019-01-05 MED ORDER — PANTOPRAZOLE SODIUM 40 MG PO TBEC: 40.0000 mg | DELAYED_RELEASE_TABLET | Freq: Every day | ORAL | 3 refills | Status: DC

## 2019-01-05 MED ORDER — ZOLPIDEM TARTRATE 10 MG PO TABS: 10.0000 mg | ORAL_TABLET | Freq: Every evening | ORAL | 5 refills | Status: DC | PRN

## 2019-01-05 NOTE — Progress Notes (Addendum)
Subjective: CC: IBS-D PCP: Alexander Ip, DO PPI:RJJOACZY A Alexander Fowler is a 38 y.o. male presenting to clinic today for:  1. IBS-D Patient reports fair stability of IBS D.  He notes that diarrhea has resolved.  He continues to have 3 normal BMs daily.  No melena/ hematochezia.  He reports compliance with Jannetta Quint.  No abdominal pain/ pain with defecation.  2.  Insomnia Patient reports longstanding history of insomnia.  He has been treated with Ambien 10 mg daily which he takes roughly about 20 out of 30 days/month.  He gets about 5 to 7 hours per night of sleep.  No soda intake or fluids before bed.  He does consume alcohol but notes that he does not take the Ambien on days that he consumes alcohol.  He drinks about 1/5 of Crown over the last 3 weeks in a single sitting.  He denies excessive sedation from the Ambien.  No falls.  No dizziness.  He notes that his alcohol consumption is down from 1 pint per day/every other day.  3.  Chronic low back pain Patient reports a longstanding history of low back pain.  He notes that it comes intermittently and that when he was initially evaluated he was told that he had a scoliotic curve.  He was recommended to seek physical therapy which he did.  He notes that MRI was never pursued, as at that time his symptoms were not determined to be significant enough by his specialist.  He denies any radiation to the lower extremities.  No numbness, tingling.  No saddle anesthesia, fecal incontinence or urinary retention.  He uses Flexeril roughly 4 times per week, always at bedtime for intermittent back spasm.   ROS: Per HPI  No Known Allergies Past Medical History:  Diagnosis Date  . GERD (gastroesophageal reflux disease)   . Insomnia     Current Outpatient Medications:  .  cyclobenzaprine (FLEXERIL) 10 MG tablet, Take 1 tablet (10 mg total) by mouth 3 (three) times daily as needed for muscle spasms., Disp: 30 tablet, Rfl: 5 .  Eluxadoline (VIBERZI) 100  MG TABS, Take 1 tablet (100 mg total) by mouth daily at 2 PM., Disp: 60 tablet, Rfl: 0 .  pantoprazole (PROTONIX) 40 MG tablet, Take 1 tablet (40 mg total) by mouth daily., Disp: 90 tablet, Rfl: 3 .  zolpidem (AMBIEN) 10 MG tablet, Take 1 tablet (10 mg total) by mouth at bedtime., Disp: 30 tablet, Rfl: 5 Social History   Socioeconomic History  . Marital status: Married    Spouse name: Not on file  . Number of children: Not on file  . Years of education: Not on file  . Highest education level: Not on file  Occupational History  . Not on file  Social Needs  . Financial resource strain: Not on file  . Food insecurity:    Worry: Not on file    Inability: Not on file  . Transportation needs:    Medical: Not on file    Non-medical: Not on file  Tobacco Use  . Smoking status: Never Smoker  . Smokeless tobacco: Never Used  Substance and Sexual Activity  . Alcohol use: Yes  . Drug use: No  . Sexual activity: Not on file  Lifestyle  . Physical activity:    Days per week: Not on file    Minutes per session: Not on file  . Stress: Not on file  Relationships  . Social connections:    Talks on phone:  Not on file    Gets together: Not on file    Attends religious service: Not on file    Active member of club or organization: Not on file    Attends meetings of clubs or organizations: Not on file    Relationship status: Not on file  . Intimate partner violence:    Fear of current or ex partner: Not on file    Emotionally abused: Not on file    Physically abused: Not on file    Forced sexual activity: Not on file  Other Topics Concern  . Not on file  Social History Narrative  . Not on file   Family History  Problem Relation Age of Onset  . Hypertension Mother   . Heart disease Mother     Objective: Office vital signs reviewed. BP 124/82   Pulse 97   Temp 98.2 F (36.8 C) (Oral)   Ht 6' (1.829 m)   Wt 282 lb (127.9 kg)   BMI 38.25 kg/m   Physical Examination:   General: Awake, alert, well nourished, No acute distress HEENT: Normal, MMM, sclera white. Cardio: regular rate and rhythm, S1S2 heard, no murmurs appreciated Pulm: clear to auscultation bilaterally, no wheezes, rhonchi or rales; normal work of breathing on room air MSK:  Lumbar spine: Full active range of motion.  No midline tenderness palpation.  No paraspinal muscle tenderness palpation.  He has increased tonicity of the right side of the paraspinal muscles.  A scoliotic curve in the thoracolumbar region is appreciated.  Assessment/ Plan: 38 y.o. male   1. Irritable bowel syndrome with diarrhea Controlled substance contract reviewed and signed today.  Obtain UDS.  Viberzi sent. - Eluxadoline (VIBERZI) 100 MG TABS; Take 1 tablet (100 mg total) by mouth daily at 2 PM.  Dispense: 60 tablet; Refill: 5 - ToxASSURE Select 13 (MW), Urine  2. Primary insomnia Avoid ETOH with ambien.  Obtain UDS. - zolpidem (AMBIEN) 10 MG tablet; Take 1 tablet (10 mg total) by mouth at bedtime as needed for sleep.  Dispense: 30 tablet; Refill: 5 - ToxASSURE Select 13 (MW), Urine  3. Gastroesophageal reflux disease without esophagitis Controlled. - pantoprazole (PROTONIX) 40 MG tablet; Take 1 tablet (40 mg total) by mouth daily.  Dispense: 90 tablet; Refill: 3  4. Chronic bilateral low back pain without sciatica Offered referral to specialist, he declines today. - cyclobenzaprine (FLEXERIL) 10 MG tablet; Take 1 tablet (10 mg total) by mouth 3 (three) times daily as needed for muscle spasms.  Dispense: 30 tablet; Refill: 5  5. Controlled substance agreement signed - ToxASSURE Select 13 (MW), Urine   No orders of the defined types were placed in this encounter.  No orders of the defined types were placed in this encounter.  The Narcotic Database has been reviewed.  There were no red flags.      Alexander IpAshly M Gottschalk, DO [facility] [phone]

## 2019-01-05 NOTE — Patient Instructions (Signed)
Controlled substance Guidelines:  1. You cannot get an early refill, even it is lost.  2. You cannot get controlled medications from any other doctor, unless it is the emergency department and related to a new problem or injury.  3. You cannot use alcohol, marijuana, cocaine or any other recreational drugs while using this medication. This is very dangerous.  4. You are willing to have your urine drug tested at each visit.  5. You will not drive while using this medication, because that can put yourself and others in serious danger of an accident. 6. If any medication is stolen, then there must be a police report to verify it, or it cannot be refilled.  7. I will not prescribe these medications for longer than 3 months.  8. You must bring your pill bottle to each visit.  9. You must use the same pharmacy for all refills for the medication, unless you clear it with me beforehand.  10. You cannot share or sell this medication.

## 2019-01-08 LAB — TOXASSURE SELECT 13 (MW), URINE

## 2019-02-26 ENCOUNTER — Other Ambulatory Visit: Payer: Self-pay | Admitting: Physician Assistant

## 2019-02-26 DIAGNOSIS — K580 Irritable bowel syndrome with diarrhea: Secondary | ICD-10-CM

## 2019-02-26 NOTE — Telephone Encounter (Signed)
Just refilled 01/05/19 w/ 5 rf

## 2019-04-23 DIAGNOSIS — S39012A Strain of muscle, fascia and tendon of lower back, initial encounter: Secondary | ICD-10-CM | POA: Diagnosis not present

## 2019-07-07 DIAGNOSIS — S90451A Superficial foreign body, right great toe, initial encounter: Secondary | ICD-10-CM | POA: Diagnosis not present

## 2019-07-07 DIAGNOSIS — M79671 Pain in right foot: Secondary | ICD-10-CM | POA: Diagnosis not present

## 2019-08-16 IMAGING — DX DG ABDOMEN 1V
3 series · 3 of 3 positions shown · non-contrast
Comparison: None.

CLINICAL DATA: Three-day history of lower abdominal pain

EXAM:
ABDOMEN - 1 VIEW

[abdomen kub (1 of 3)]
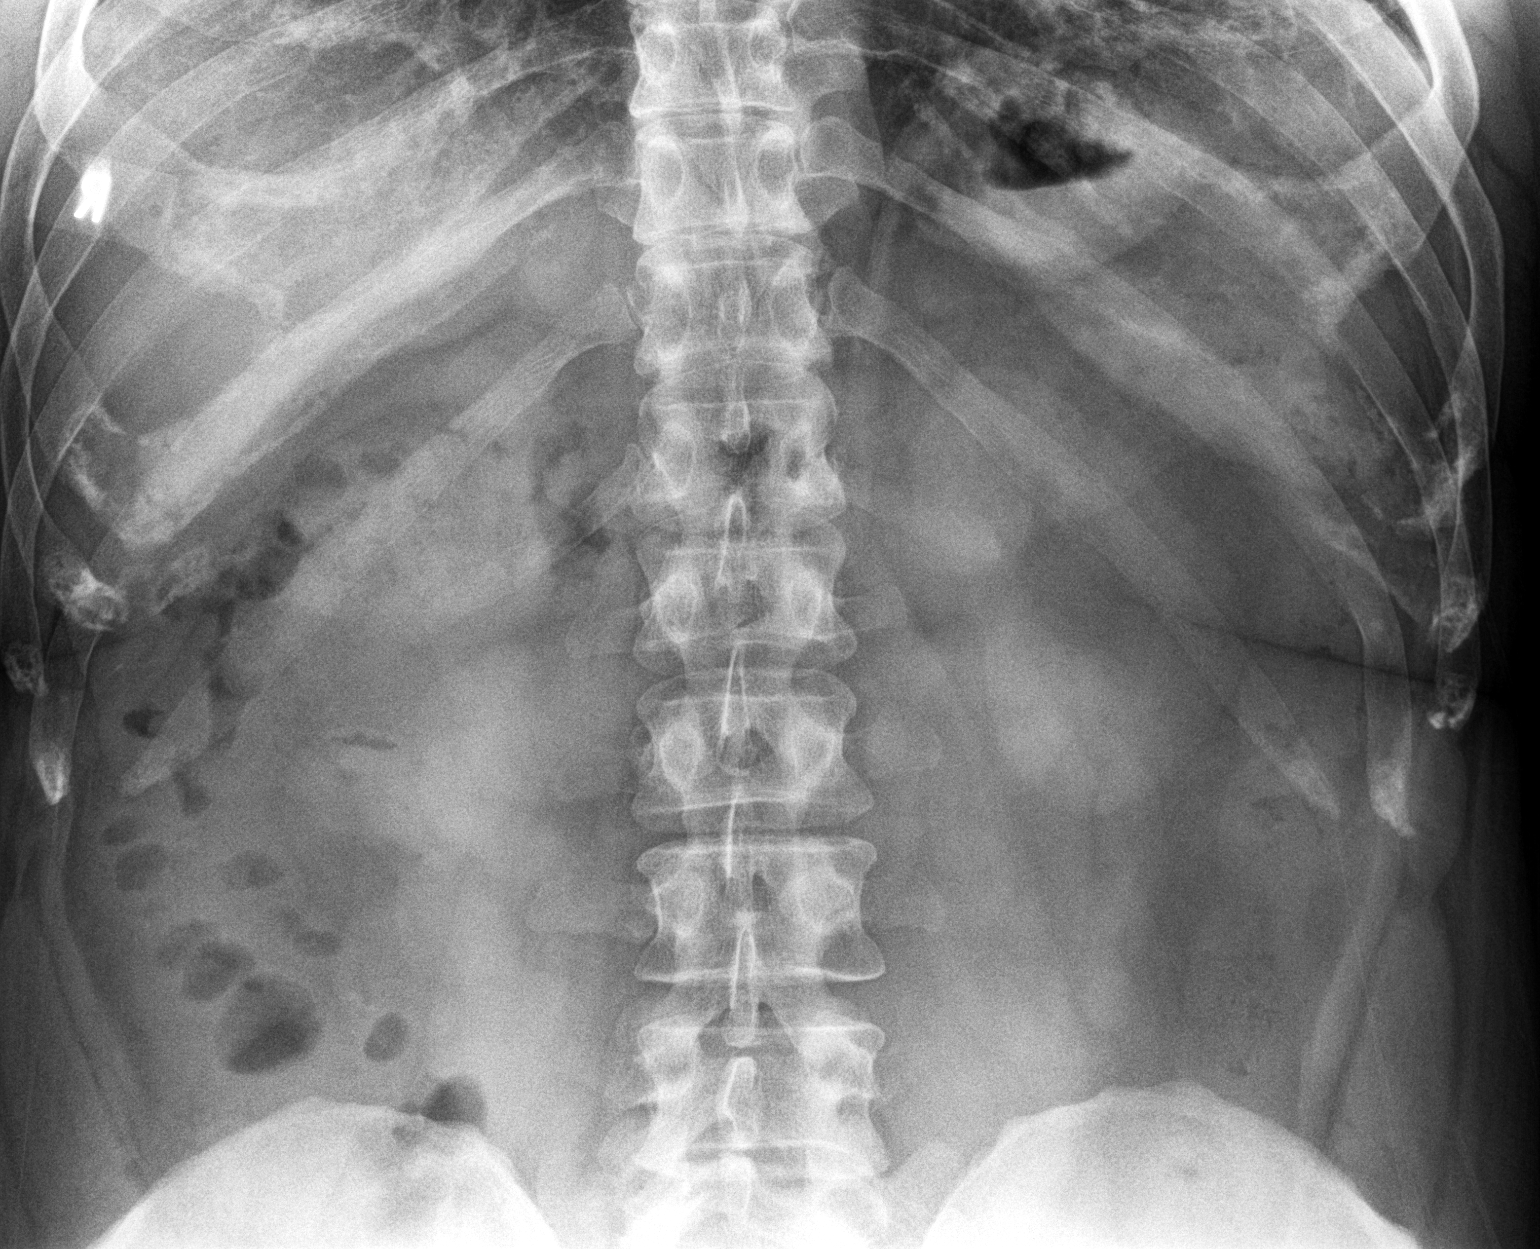

[abdomen kub (2 of 3)]
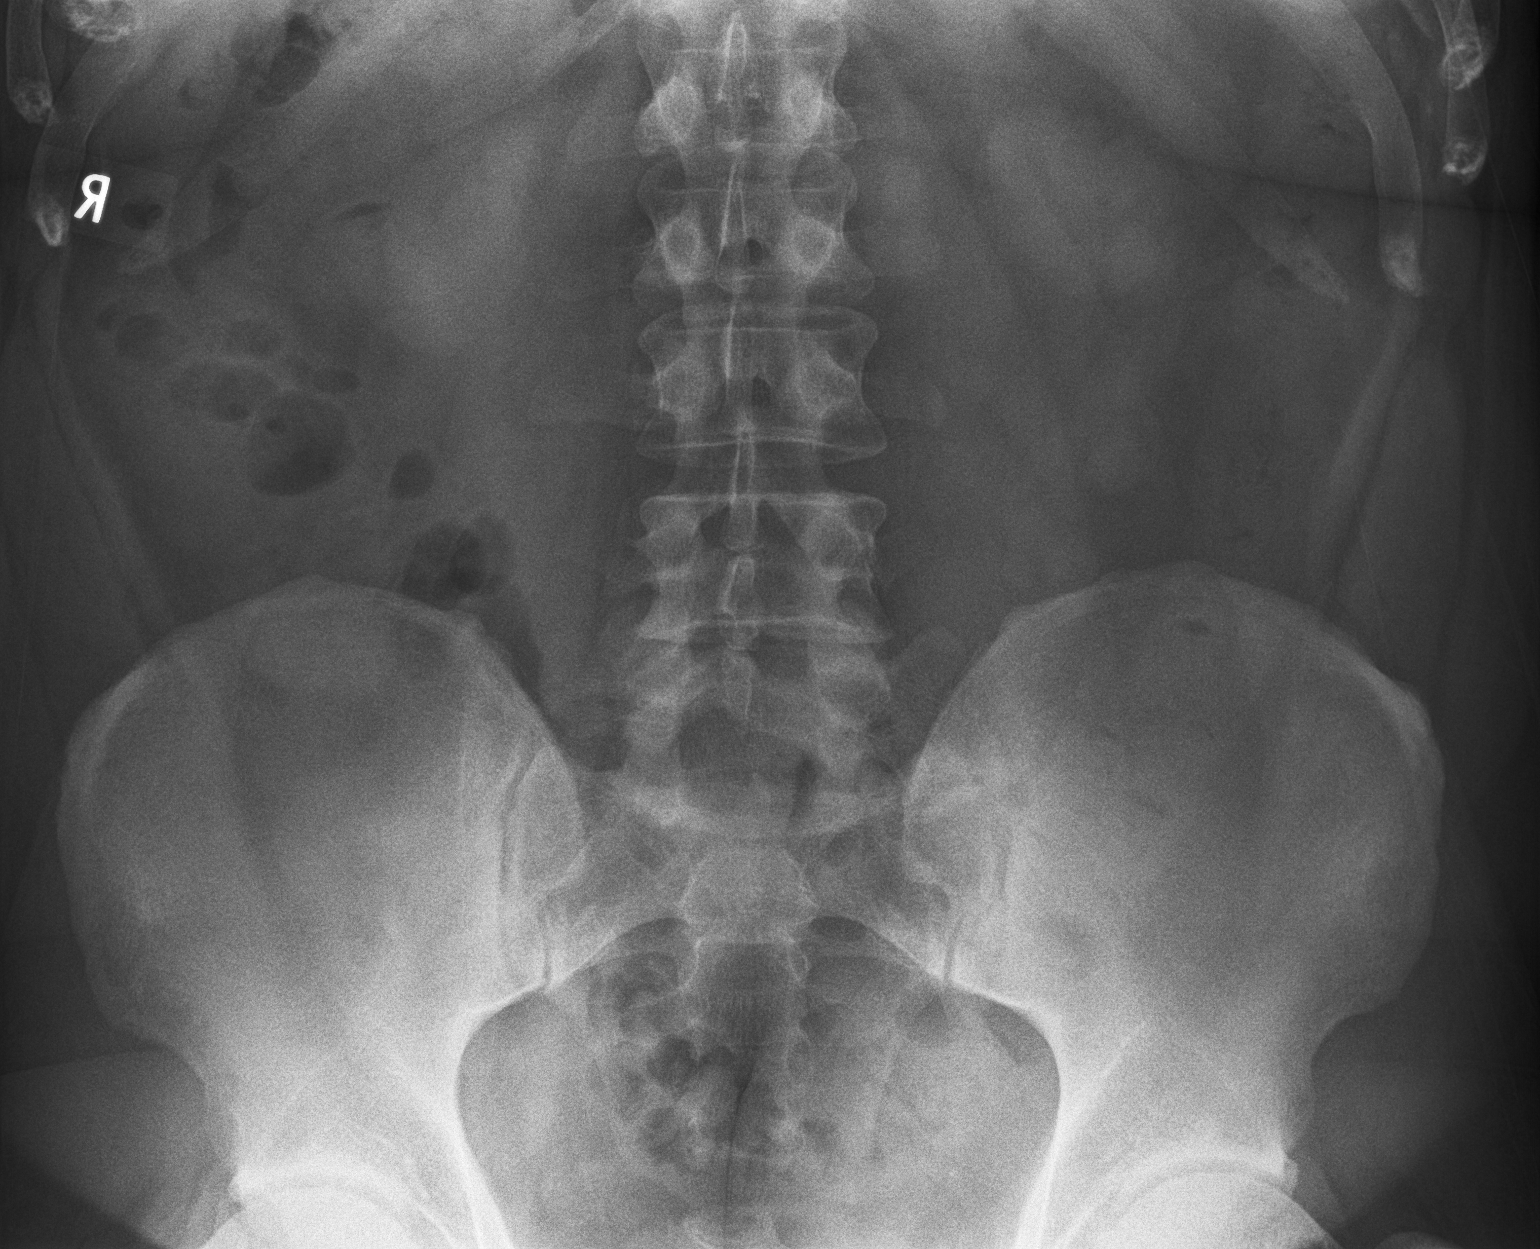

[abdomen kub (3 of 3)]
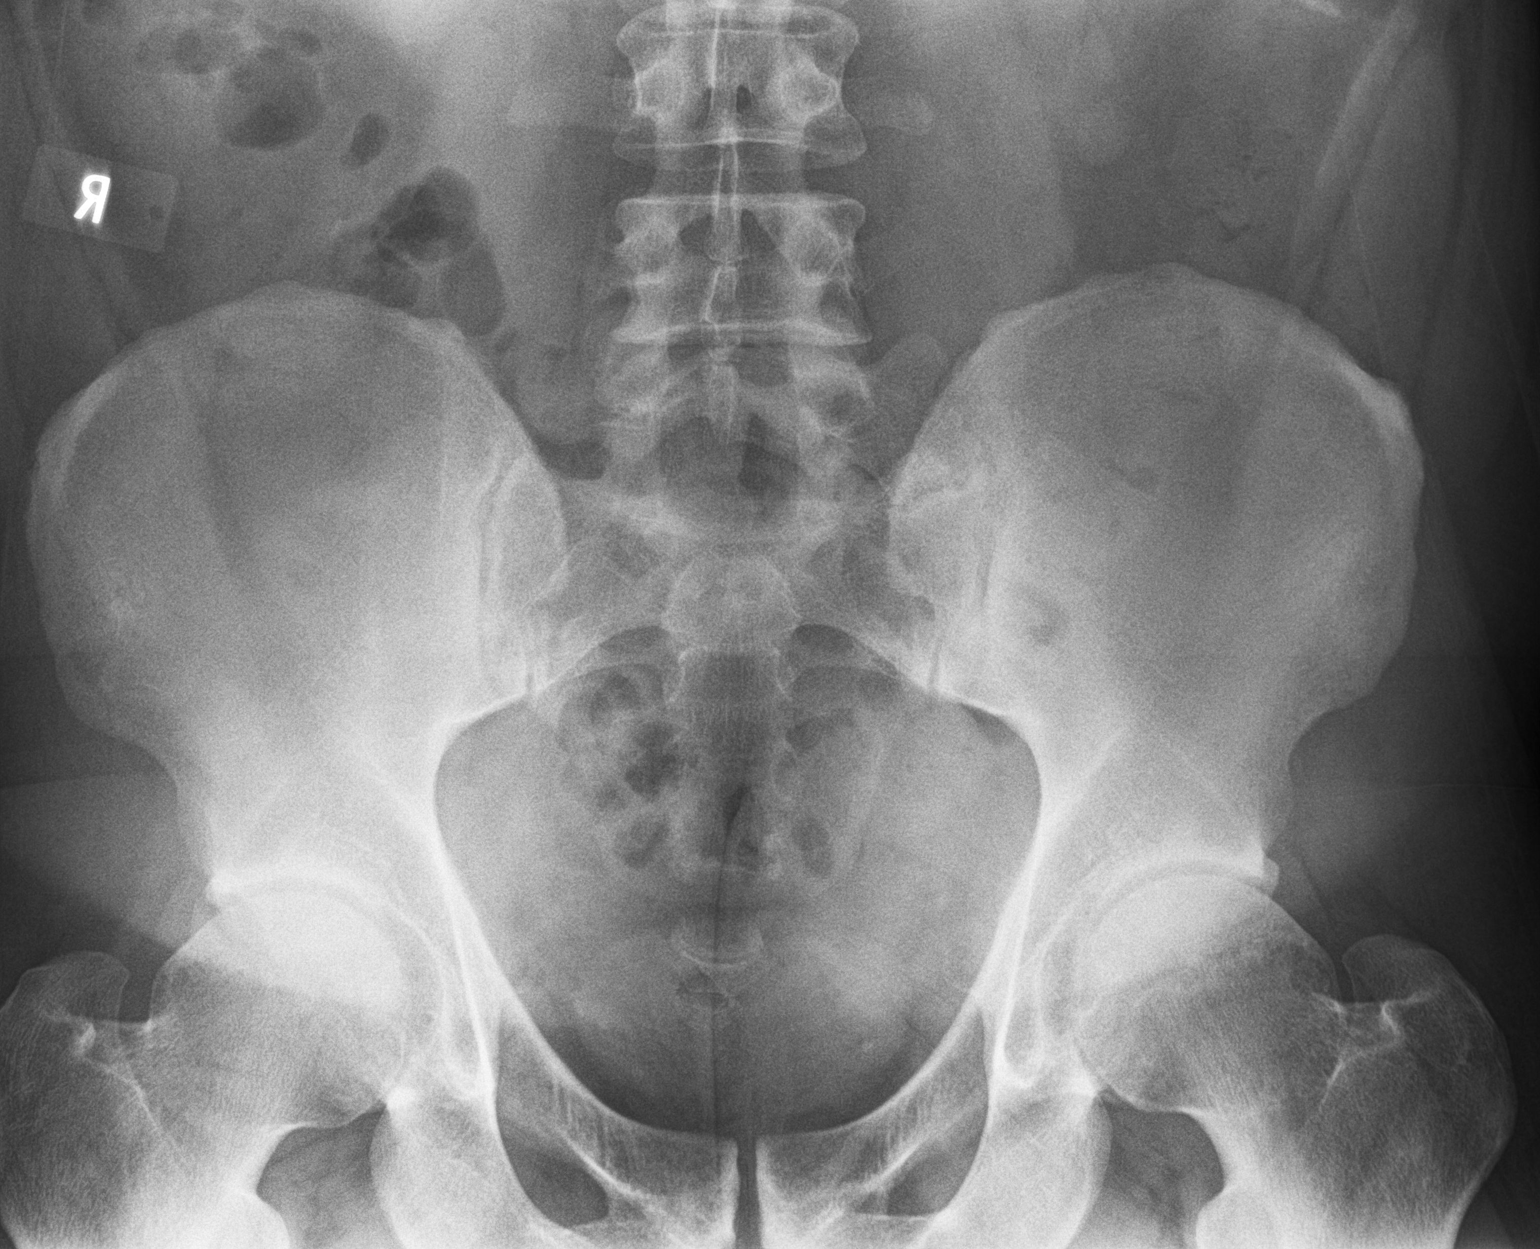

[3 of 3 positions shown; findings below may reference images not displayed]

FINDINGS: There is moderate stool in the colon. There is no appreciable bowel
dilatation or air-fluid level to suggest bowel obstruction. No free
air. There are small apparent phleboliths in the pelvis.
IMPRESSION: Moderate stool in colon.  No evident bowel obstruction or free air.

## 2019-09-01 DIAGNOSIS — S39012A Strain of muscle, fascia and tendon of lower back, initial encounter: Secondary | ICD-10-CM | POA: Diagnosis not present

## 2019-09-10 DIAGNOSIS — M79671 Pain in right foot: Secondary | ICD-10-CM | POA: Diagnosis not present

## 2019-09-10 DIAGNOSIS — Q828 Other specified congenital malformations of skin: Secondary | ICD-10-CM | POA: Diagnosis not present

## 2020-01-05 DIAGNOSIS — S39012A Strain of muscle, fascia and tendon of lower back, initial encounter: Secondary | ICD-10-CM | POA: Diagnosis not present

## 2020-01-06 DIAGNOSIS — S39012A Strain of muscle, fascia and tendon of lower back, initial encounter: Secondary | ICD-10-CM | POA: Diagnosis not present

## 2020-01-07 DIAGNOSIS — S39012A Strain of muscle, fascia and tendon of lower back, initial encounter: Secondary | ICD-10-CM | POA: Diagnosis not present

## 2020-03-13 ENCOUNTER — Other Ambulatory Visit: Payer: Self-pay | Admitting: Family Medicine

## 2020-03-13 DIAGNOSIS — F5101 Primary insomnia: Secondary | ICD-10-CM

## 2020-03-13 DIAGNOSIS — K219 Gastro-esophageal reflux disease without esophagitis: Secondary | ICD-10-CM

## 2020-03-14 NOTE — Telephone Encounter (Signed)
Last OV over a year ago   Ambien denied-controlled ntbs for refills

## 2020-03-15 MED ORDER — PANTOPRAZOLE SODIUM 40 MG PO TBEC: 40.0000 mg | DELAYED_RELEASE_TABLET | Freq: Every day | ORAL | 0 refills | Status: DC

## 2020-03-15 NOTE — Telephone Encounter (Signed)
Called patient - he states that he does not need Ambien refilled he has not taken in about 1 year. Patient does need refill on Protonix - explained to patient OV still needed as we have not seen him in over a year.  Appt made with [facility] 04/15/2020 @ 130 - 30 days of medication sent in for patient until OV.

## 2020-04-13 ENCOUNTER — Other Ambulatory Visit: Payer: Self-pay | Admitting: Family Medicine

## 2020-04-13 DIAGNOSIS — K219 Gastro-esophageal reflux disease without esophagitis: Secondary | ICD-10-CM

## 2020-04-15 ENCOUNTER — Encounter: Payer: Self-pay | Admitting: Family Medicine

## 2020-04-15 ENCOUNTER — Other Ambulatory Visit: Payer: Self-pay

## 2020-04-15 ENCOUNTER — Ambulatory Visit (INDEPENDENT_AMBULATORY_CARE_PROVIDER_SITE_OTHER): Payer: BC Managed Care – PPO | Admitting: Family Medicine

## 2020-04-15 VITALS — BP 138/88 | HR 100 | Temp 97.8°F | Ht 72.0 in | Wt 237.0 lb

## 2020-04-15 DIAGNOSIS — K580 Irritable bowel syndrome with diarrhea: Secondary | ICD-10-CM | POA: Diagnosis not present

## 2020-04-15 DIAGNOSIS — G8929 Other chronic pain: Secondary | ICD-10-CM | POA: Diagnosis not present

## 2020-04-15 DIAGNOSIS — K219 Gastro-esophageal reflux disease without esophagitis: Secondary | ICD-10-CM

## 2020-04-15 DIAGNOSIS — M545 Low back pain: Secondary | ICD-10-CM | POA: Diagnosis not present

## 2020-04-15 MED ORDER — CYCLOBENZAPRINE HCL 10 MG PO TABS: 10.0000 mg | ORAL_TABLET | Freq: Three times a day (TID) | ORAL | 5 refills | Status: AC | PRN

## 2020-04-15 MED ORDER — PANTOPRAZOLE SODIUM 40 MG PO TBEC: 40.0000 mg | DELAYED_RELEASE_TABLET | Freq: Every day | ORAL | 3 refills | Status: DC

## 2020-04-15 NOTE — Progress Notes (Signed)
Subjective: CC: IBS-D PCP: [name], DO SWF:UXNATFTD A Alexander Fowler is a 39 y.o. male presenting to clinic today for:  1. IBS-D Patient reports good stability of IBS D.  He has lost weight and changed his diet and not needed the Viberzi anymore.  He has not had any diarrhea, nausea, vomiting, abdominal pain.  2.  Chronic low back pain Patient reports a longstanding history of low back pain.  Not using this medication more than two or three times per month.  Denies excessive daytime sedation, falls.  He absolutely does not drink with this medication.  ROS: Per HPI  No Known Allergies Past Medical History:  Diagnosis Date   GERD (gastroesophageal reflux disease)    Insomnia     Current Outpatient Medications:    cyclobenzaprine (FLEXERIL) 10 MG tablet, Take 1 tablet (10 mg total) by mouth 3 (three) times daily as needed for muscle spasms., Disp: 30 tablet, Rfl: 5   Eluxadoline (VIBERZI) 100 MG TABS, Take 1 tablet (100 mg total) by mouth daily at 2 PM., Disp: 60 tablet, Rfl: 5   pantoprazole (PROTONIX) 40 MG tablet, TAKE 1 TABLET BY MOUTH EVERY DAY, Disp: 30 tablet, Rfl: 0 Social History   Socioeconomic History   Marital status: Married    Spouse name: Not on file   Number of children: Not on file   Years of education: Not on file   Highest education level: Not on file  Occupational History   Not on file  Tobacco Use   Smoking status: Never Smoker   Smokeless tobacco: Never Used  Vaping Use   Vaping Use: Never used  Substance and Sexual Activity   Alcohol use: Yes   Drug use: No   Sexual activity: Not on file  Other Topics Concern   Not on file  Social History Narrative   Not on file   Social Determinants of Health   Financial Resource Strain:    Difficulty of Paying Living Expenses:   Food Insecurity:    Worried About Programme researcher, broadcasting/film/video in the Last Year:    Barista in the Last Year:   Transportation Needs:    Automotive engineer (Medical):    Lack of Transportation (Non-Medical):   Physical Activity:    Days of Exercise per Week:    Minutes of Exercise per Session:   Stress:    Feeling of Stress :   Social Connections:    Frequency of Communication with Friends and Family:    Frequency of Social Gatherings with Friends and Family:    Attends Religious Services:    Active Member of Clubs or Organizations:    Attends Engineer, structural:    Marital Status:   Intimate Partner Violence:    Fear of Current or Ex-Partner:    Emotionally Abused:    Physically Abused:    Sexually Abused:    Family History  Problem Relation Age of Onset   Hypertension Mother    Heart disease Mother     Objective: Office vital signs reviewed. BP 138/88    Pulse 100    Temp 97.8 F (36.6 C) (Temporal)    Ht 6' (1.829 m)    Wt 237 lb (107.5 kg)    SpO2 98%    BMI 32.14 kg/m   Physical Examination:  General: Awake, alert, well nourished, No acute distress HEENT: Normal, MMM, sclera white. Cardio: regular rate and rhythm, S1S2 heard, no murmurs appreciated Pulm: clear  to auscultation bilaterally, no wheezes, rhonchi or rales; normal work of breathing on room air MSK: Ambulating independently.  Normal tone.  Assessment/ Plan: 39 y.o. male   1. Irritable bowel syndrome with diarrhea Stable with diet modification.  By per continued  2. Gastroesophageal reflux disease without esophagitis Controlled.  Continue Protonix - pantoprazole (PROTONIX) 40 MG tablet; Take 1 tablet (40 mg total) by mouth daily.  Dispense: 90 tablet; Refill: 3  3. Chronic bilateral low back pain without sciatica Stable with as needed use of Flexeril sent - cyclobenzaprine (FLEXERIL) 10 MG tablet; Take 1 tablet (10 mg total) by mouth 3 (three) times daily as needed for muscle spasms.  Dispense: 30 tablet; Refill: 5    No orders of the defined types were placed in this encounter.  No orders of the defined  types were placed in this encounter.  [name], DO [facility] [phone]

## 2020-06-08 ENCOUNTER — Telehealth: Payer: Self-pay

## 2020-06-08 DIAGNOSIS — K219 Gastro-esophageal reflux disease without esophagitis: Secondary | ICD-10-CM

## 2020-06-08 MED ORDER — PANTOPRAZOLE SODIUM 40 MG PO TBEC: 40.0000 mg | DELAYED_RELEASE_TABLET | Freq: Every day | ORAL | 3 refills | Status: AC

## 2020-06-08 NOTE — Telephone Encounter (Signed)
Refill sent.
